# Patient Record
Sex: Female | Born: 1990 | Race: Black or African American | Hispanic: No | Marital: Single | Smoking: Never smoker
Health system: Southern US, Community
[De-identification: ages and names within clinical notes are randomized; demographics above are authoritative.]

## PROBLEM LIST (undated history)

## (undated) ENCOUNTER — Inpatient Hospital Stay (HOSPITAL_COMMUNITY): Payer: Self-pay

## (undated) ENCOUNTER — Emergency Department (HOSPITAL_COMMUNITY): Admission: EM | Payer: Self-pay

## (undated) DIAGNOSIS — Z789 Other specified health status: Secondary | ICD-10-CM

## (undated) DIAGNOSIS — Z9889 Other specified postprocedural states: Secondary | ICD-10-CM

## (undated) DIAGNOSIS — R519 Headache, unspecified: Secondary | ICD-10-CM

## (undated) HISTORY — DX: Headache, unspecified: R51.9

---

## 2015-02-23 ENCOUNTER — Inpatient Hospital Stay (HOSPITAL_COMMUNITY)
Admission: AD | Admit: 2015-02-23 | Discharge: 2015-02-23 | Disposition: A | Discharge status: Home / Self Care | Payer: Self-pay | Source: Ambulatory Visit | Attending: Obstetrics and Gynecology | Admitting: Obstetrics and Gynecology

## 2015-02-23 ENCOUNTER — Encounter (HOSPITAL_COMMUNITY): Payer: Self-pay

## 2015-02-23 ENCOUNTER — Inpatient Hospital Stay (HOSPITAL_COMMUNITY): Payer: Self-pay

## 2015-02-23 DIAGNOSIS — O23591 Infection of other part of genital tract in pregnancy, first trimester: Secondary | ICD-10-CM | POA: Insufficient documentation

## 2015-02-23 DIAGNOSIS — N760 Acute vaginitis: Secondary | ICD-10-CM | POA: Insufficient documentation

## 2015-02-23 DIAGNOSIS — Z3A01 Less than 8 weeks gestation of pregnancy: Secondary | ICD-10-CM | POA: Insufficient documentation

## 2015-02-23 DIAGNOSIS — O26899 Other specified pregnancy related conditions, unspecified trimester: Secondary | ICD-10-CM

## 2015-02-23 DIAGNOSIS — A499 Bacterial infection, unspecified: Secondary | ICD-10-CM

## 2015-02-23 DIAGNOSIS — B9689 Other specified bacterial agents as the cause of diseases classified elsewhere: Secondary | ICD-10-CM | POA: Insufficient documentation

## 2015-02-23 DIAGNOSIS — R109 Unspecified abdominal pain: Secondary | ICD-10-CM | POA: Insufficient documentation

## 2015-02-23 DIAGNOSIS — O9989 Other specified diseases and conditions complicating pregnancy, childbirth and the puerperium: Secondary | ICD-10-CM

## 2015-02-23 HISTORY — DX: Other specified health status: Z78.9

## 2015-02-23 LAB — URINALYSIS, ROUTINE W REFLEX MICROSCOPIC
Bilirubin Urine: NEGATIVE
GLUCOSE, UA: NEGATIVE mg/dL
HGB URINE DIPSTICK: NEGATIVE
KETONES UR: 15 mg/dL — AB
Leukocytes, UA: NEGATIVE
Nitrite: NEGATIVE
PROTEIN: 100 mg/dL — AB
Specific Gravity, Urine: 1.025 (ref 1.005–1.030)
Urobilinogen, UA: 1 mg/dL (ref 0.0–1.0)
pH: 7 (ref 5.0–8.0)

## 2015-02-23 LAB — WET PREP, GENITAL
TRICH WET PREP: NONE SEEN
YEAST WET PREP: NONE SEEN

## 2015-02-23 LAB — CBC
HCT: 32.5 % — ABNORMAL LOW (ref 36.0–46.0)
HEMOGLOBIN: 11 g/dL — AB (ref 12.0–15.0)
MCH: 27.8 pg (ref 26.0–34.0)
MCHC: 33.8 g/dL (ref 30.0–36.0)
MCV: 82.1 fL (ref 78.0–100.0)
Platelets: 418 10*3/uL — ABNORMAL HIGH (ref 150–400)
RBC: 3.96 MIL/uL (ref 3.87–5.11)
RDW: 15.6 % — ABNORMAL HIGH (ref 11.5–15.5)
WBC: 7.6 10*3/uL (ref 4.0–10.5)

## 2015-02-23 LAB — ABO/RH: ABO/RH(D): A POS

## 2015-02-23 LAB — URINE MICROSCOPIC-ADD ON

## 2015-02-23 LAB — HCG, QUANTITATIVE, PREGNANCY: hCG, Beta Chain, Quant, S: 86273 m[IU]/mL — ABNORMAL HIGH (ref <5)

## 2015-02-23 LAB — POCT PREGNANCY, URINE: Preg Test, Ur: POSITIVE — AB

## 2015-02-23 MED ORDER — METRONIDAZOLE 500 MG PO TABS: 500.0000 mg | ORAL_TABLET | Freq: Two times a day (BID) | ORAL | Status: DC

## 2015-02-23 NOTE — MAU Provider Note (Signed)
History     CSN: [id number]  Arrival date and time: 02/23/15 1150   First Provider Initiated Contact with Patient 02/23/15 1312         Chief Complaint  Patient presents with  . Abdominal Pain   HPI  Deanna May is a 24 y.o. G4P3003 at unknown gestation who present s/p fall with abdominal cramping.  Tripped over chair last night and landed on abdomen. LMP was sometime the beginning of July, she thinks. Lower abdominal cramping since last night.  Denies vaginal bleeding.  Some vaginal discharge. No vaginal irritation.  Denies n/v/d/constipation.    OB History    Gravida Para Term Preterm AB TAB SAB Ectopic Multiple Living   Past Medical History  Diagnosis Date  . Medical history non-contributory     Past Surgical History  Procedure Laterality Date  . Cesarean section      History reviewed. No pertinent family history.  Social History  Substance Use Topics  . Smoking status: Never Smoker   . Smokeless tobacco: Never Used  . Alcohol Use: No    Allergies: No Known Allergies  No prescriptions prior to admission    Review of Systems  Constitutional: Negative.   Respiratory: Negative.   Cardiovascular: Negative.   Gastrointestinal: Positive for nausea and abdominal pain. Negative for vomiting, diarrhea and constipation.  Genitourinary: Negative for dysuria.       No vaginal bleeding + vaginal discharge - small amount of thin white d/c, no odor, no irritation  Neurological: Negative for headaches.   Physical Exam   Blood pressure 118/63, pulse 93, temperature 98.6 F (37 C), resp. rate 18, height  (1.727 m), weight 153 lb (69.4 kg), last menstrual period 11/30/2014.  Physical Exam  Nursing note and vitals reviewed. Constitutional: She is oriented to person, place, and time. She appears well-developed and well-nourished. No distress.  HENT:  Head: Normocephalic and atraumatic.  Eyes: Conjunctivae are normal. Right eye  exhibits no discharge. Left eye exhibits no discharge. No scleral icterus.  Neck: Normal range of motion.  Cardiovascular: Normal rate, regular rhythm and normal heart sounds.   No murmur heard. Respiratory: Effort normal and breath sounds normal. No respiratory distress. She has no wheezes.  GI: Soft. Bowel sounds are normal. She exhibits no distension and no mass. There is no tenderness. There is no rebound.  Genitourinary: Vagina normal and uterus normal. Cervix exhibits discharge. Cervix exhibits no motion tenderness and no friability. Right adnexum displays no mass, no tenderness and no fullness. Left adnexum displays no mass, no tenderness and no fullness.  Minimal amount of clear mucoid d/c Cervix closed  Neurological: She is alert and oriented to person, place, and time.  Skin: Skin is warm and dry. She is not diaphoretic.  Psychiatric: She has a normal mood and affect. Her behavior is normal. Judgment and thought content normal.    MAU Course  Procedures Results for orders placed or performed during the hospital encounter of 02/23/15 (from the past 24 hour(s))  Urinalysis, Routine w reflex microscopic (not at [facility])     Status: Abnormal   Collection Time: 02/23/15 12:00 PM  Result Value Ref Range   Color, Urine YELLOW YELLOW   APPearance CLOUDY (A) CLEAR   Specific Gravity, Urine 1.025 1.005 - 1.030   pH 7.0 5.0 - 8.0   Glucose, UA NEGATIVE NEGATIVE mg/dL   Hgb urine dipstick NEGATIVE NEGATIVE  Bilirubin Urine NEGATIVE NEGATIVE   Ketones, ur 15 (A) NEGATIVE mg/dL   Protein, ur 130 (A) NEGATIVE mg/dL   Urobilinogen, UA 1.0 0.0 - 1.0 mg/dL   Nitrite NEGATIVE NEGATIVE   Leukocytes, UA NEGATIVE NEGATIVE  Urine microscopic-add on     Status: Abnormal   Collection Time: 02/23/15 12:00 PM  Result Value Ref Range   Squamous Epithelial / LPF MANY (A) RARE   WBC, UA 3-6 <3 WBC/hpf   Bacteria, UA FEW (A) RARE   Urine-Other MUCOUS PRESENT   Pregnancy, urine POC     Status:  Abnormal   Collection Time: 02/23/15 12:19 PM  Result Value Ref Range   Preg Test, Ur POSITIVE (A) NEGATIVE  CBC     Status: Abnormal   Collection Time: 02/23/15  1:30 PM  Result Value Ref Range   WBC 7.6 4.0 - 10.5 K/uL   RBC 3.96 3.87 - 5.11 MIL/uL   Hemoglobin 11.0 (L) 12.0 - 15.0 g/dL   HCT 86.5 (L) 78.4 - 69.6 %   MCV 82.1 78.0 - 100.0 fL   MCH 27.8 26.0 - 34.0 pg   MCHC 33.8 30.0 - 36.0 g/dL   RDW 29.5 (H) 28.4 - 13.2 %   Platelets 418 (H) 150 - 400 K/uL  ABO/Rh     Status: None (Preliminary result)   Collection Time: 02/23/15  1:30 PM  Result Value Ref Range   ABO/RH(D) A POS   hCG, quantitative, pregnancy     Status: Abnormal   Collection Time: 02/23/15  1:30 PM  Result Value Ref Range   hCG, Beta Chain, Quant, S 44010 (H) <5 mIU/mL  Wet prep, genital     Status: Abnormal   Collection Time: 02/23/15  1:30 PM  Result Value Ref Range   Yeast Wet Prep HPF POC NONE SEEN NONE SEEN   Trich, Wet Prep NONE SEEN NONE SEEN   Clue Cells Wet Prep HPF POC MODERATE (A) NONE SEEN   WBC, Wet Prep HPF POC FEW (A) NONE SEEN   US Ob Comp Less 14 Wks  02/23/2015   CLINICAL DATA:  24 year old female with pelvic cramping 02/22/2015 in the evening.  EXAM: OBSTETRIC <14 WK [place] AND TRANSVAGINAL OB US  TECHNIQUE: Both transabdominal and transvaginal ultrasound examinations were performed for complete evaluation of the gestation as well as the maternal uterus, adnexal regions, and pelvic cul-de-sac. Transvaginal technique was performed to assess early pregnancy.  COMPARISON:  No priors.  FINDINGS: Intrauterine gestational sac: Single gestational sac could ovoid in shape.  Yolk sac:  Present.  Embryo:  Present.  Cardiac Activity: Present.  Heart Rate: 149  bpm  CRL:  12  mm   7 w   2 d                  [place] EDC: 10/10/2015  Maternal uterus/adnexae: No subchorionic hemorrhage or other acute abnormality. No free fluid in the cul-de-sac. Bilateral ovaries are normal in appearance.  IMPRESSION: 1. Single  viable early IUP with estimated gestational age of [redacted] weeks and 2 days, and normal fetal heart rate of 149 beats per min.   Electronically Signed   By: Trudie Reed M.D.   On: 02/23/2015 15:10   US Ob Transvaginal  02/23/2015   CLINICAL DATA:  24 year old female with pelvic cramping 02/22/2015 in the evening.  EXAM: OBSTETRIC <14 WK [place] AND TRANSVAGINAL OB US  TECHNIQUE: Both transabdominal and transvaginal ultrasound examinations were performed for complete evaluation of the gestation  as well as the maternal uterus, adnexal regions, and pelvic cul-de-sac. Transvaginal technique was performed to assess early pregnancy.  COMPARISON:  No priors.  FINDINGS: Intrauterine gestational sac: Single gestational sac could ovoid in shape.  Yolk sac:  Present.  Embryo:  Present.  Cardiac Activity: Present.  Heart Rate: 149  bpm  CRL:  12  mm   7 w   2 d                  [place] EDC: 10/10/2015  Maternal uterus/adnexae: No subchorionic hemorrhage or other acute abnormality. No free fluid in the cul-de-sac. Bilateral ovaries are normal in appearance.  IMPRESSION: 1. Single viable early IUP with estimated gestational age of [redacted] weeks and 2 days, and normal fetal heart rate of 149 beats per min.   Electronically Signed   By: Trudie Reed M.D.   On: 02/23/2015 15:10     MDM Unable to doppler FHT; will send for ultrasound CBC, BHCG, abo/rh, HIV, wet prep, GC/CT SIUP @ [redacted]w[redacted]d Assessment and Plan  A: 1. Abdominal pain in pregnancy   2. Bacterial vaginosis    P: Discharge home Rx flagyl Pregnancy verification letter & list of providers given Start prenatal care Increase fluid intake and take prenatal vitamins Discussed reasons to return to MAU  GC/CT & HIV pending  Judeth Horn, NP  02/23/2015, 1:02 PM

## 2015-02-23 NOTE — MAU Note (Signed)
Denies uti s/s, LMP roughly in July.

## 2015-02-23 NOTE — Discharge Instructions (Signed)
°Abdominal Pain During Pregnancy °Abdominal pain is common in pregnancy. Most of the time, it does not cause harm. There are many causes of abdominal pain. Some causes are more serious than others. Some of the causes of abdominal pain in pregnancy are easily diagnosed. Occasionally, the diagnosis takes time to understand. Other times, the cause is not determined. Abdominal pain can be a sign that something is very wrong with the pregnancy, or the pain may have nothing to do with the pregnancy at all. For this reason, always tell your health care provider if you have any abdominal discomfort. °HOME CARE INSTRUCTIONS  °Monitor your abdominal pain for any changes. The following actions may help to alleviate any discomfort you are experiencing: °· Do not have sexual intercourse or put anything in your vagina until your symptoms go away completely. °· Get plenty of rest until your pain improves. °· Drink clear fluids if you feel nauseous. Avoid solid food as long as you are uncomfortable or nauseous. °· Only take over-the-counter or prescription medicine as directed by your health care provider. °· Keep all follow-up appointments with your health care provider. °SEEK IMMEDIATE MEDICAL CARE IF: °· You are bleeding, leaking fluid, or passing tissue from the vagina. °· You have increasing pain or cramping. °· You have persistent vomiting. °· You have painful or bloody urination. °· You have a fever. °· You notice a decrease in your baby's movements. °· You have extreme weakness or feel faint. °· You have shortness of breath, with or without abdominal pain. °· You develop a severe headache with abdominal pain. °· You have abnormal vaginal discharge with abdominal pain. °· You have persistent diarrhea. °· You have abdominal pain that continues even after rest, or gets worse. °MAKE SURE YOU:  °· Understand these instructions. °· Will watch your condition. °· Will get help right away if you are not doing well or get  worse. °Document Released: 05/18/2005 Document Revised: 03/08/2013 Document Reviewed: 12/15/2012 °ExitCare® Patient Information ©2015 [employer]. This information is not intended to replace advice given to you by your health care provider. Make sure you discuss any questions you have with your health care provider. °Bacterial Vaginosis °Bacterial vaginosis is a vaginal infection that occurs when the normal balance of bacteria in the vagina is disrupted. It results from an overgrowth of certain bacteria. This is the most common vaginal infection in women of childbearing age. Treatment is important to prevent complications, especially in pregnant women, as it can cause a premature delivery. °CAUSES  °Bacterial vaginosis is caused by an increase in harmful bacteria that are normally present in smaller amounts in the vagina. Several different kinds of bacteria can cause bacterial vaginosis. However, the reason that the condition develops is not fully understood. °RISK FACTORS °Certain activities or behaviors can put you at an increased risk of developing bacterial vaginosis, including: °· Having a new sex partner or multiple sex partners. °· Douching. °· Using an intrauterine device (IUD) for contraception. °Women do not get bacterial vaginosis from toilet seats, bedding, swimming pools, or contact with objects around them. °SIGNS AND SYMPTOMS  °Some women with bacterial vaginosis have no signs or symptoms. Common symptoms include: °· Grey vaginal discharge. °· A fishlike odor with discharge, especially after sexual intercourse. °· Itching or burning of the vagina and vulva. °· Burning or pain with urination. °DIAGNOSIS  °Your health care provider will take a medical history and examine the vagina for signs of bacterial vaginosis. A sample of vaginal fluid may   be taken. Your health care provider will look at this sample under a microscope to check for bacteria and abnormal cells. A vaginal pH test may also be done.   °TREATMENT  °Bacterial vaginosis may be treated with antibiotic medicines. These may be given in the form of a pill or a vaginal cream. A second round of antibiotics may be prescribed if the condition comes back after treatment.  °HOME CARE INSTRUCTIONS  °· Only take over-the-counter or prescription medicines as directed by your health care provider. °· If antibiotic medicine was prescribed, take it as directed. Make sure you finish it even if you start to feel better. °· Do not have sex until treatment is completed. °· Tell all sexual partners that you have a vaginal infection. They should see their health care provider and be treated if they have problems, such as a mild rash or itching. °· Practice safe sex by using condoms and only having one sex partner. °SEEK MEDICAL CARE IF:  °· Your symptoms are not improving after 3 days of treatment. °· You have increased discharge or pain. °· You have a fever. °MAKE SURE YOU:  °· Understand these instructions. °· Will watch your condition. °· Will get help right away if you are not doing well or get worse. °FOR MORE INFORMATION  °Centers for Disease Control and Prevention, Division of STD Prevention: www.cdc.gov/std °American Sexual Health Association (ASHA): www.ashastd.org  °Document Released: 05/18/2005 Document Revised: 03/08/2013 Document Reviewed: 12/28/2012 °ExitCare® Patient Information ©2015 [employer]. This information is not intended to replace advice given to you by your health care provider. Make sure you discuss any questions you have with your health care provider. ° °

## 2015-02-23 NOTE — MAU Note (Signed)
Pt presents to MAU with complaints of lower abdominal cramping with + home pregnancy test two weeks ago. Also states that she fell over a chair and landed on her abdomen. Denies any vaginal bleeding

## 2015-02-23 NOTE — MAU Note (Signed)
Unable to doppler FHTs 

## 2015-02-24 LAB — HIV ANTIBODY (ROUTINE TESTING W REFLEX): HIV Screen 4th Generation wRfx: NONREACTIVE

## 2015-02-25 LAB — GC/CHLAMYDIA PROBE AMP (~~LOC~~) NOT AT ARMC
CHLAMYDIA, DNA PROBE: NEGATIVE
Neisseria Gonorrhea: NEGATIVE

## 2015-12-29 ENCOUNTER — Encounter (HOSPITAL_COMMUNITY): Payer: Self-pay

## 2016-06-01 DIAGNOSIS — F32A Depression, unspecified: Secondary | ICD-10-CM

## 2016-06-01 HISTORY — DX: Depression, unspecified: F32.A

## 2018-06-10 ENCOUNTER — Encounter: Payer: Self-pay | Admitting: Internal Medicine

## 2018-06-10 ENCOUNTER — Ambulatory Visit: Payer: Self-pay | Admitting: Internal Medicine

## 2018-06-10 VITALS — BP 126/82 | HR 72 | Resp 12 | Ht 69.0 in | Wt 180.0 lb

## 2018-06-10 DIAGNOSIS — T8332XA Displacement of intrauterine contraceptive device, initial encounter: Secondary | ICD-10-CM

## 2018-06-10 DIAGNOSIS — N898 Other specified noninflammatory disorders of vagina: Secondary | ICD-10-CM

## 2018-06-10 DIAGNOSIS — F321 Major depressive disorder, single episode, moderate: Secondary | ICD-10-CM

## 2018-06-10 DIAGNOSIS — R102 Pelvic and perineal pain: Secondary | ICD-10-CM

## 2018-06-10 LAB — POCT URINALYSIS DIPSTICK
Blood, UA: NEGATIVE
Glucose, UA: NEGATIVE
Leukocytes, UA: NEGATIVE
Nitrite, UA: NEGATIVE
PROTEIN UA: POSITIVE — AB
Spec Grav, UA: >=1.03 — AB (ref 1.010–1.025)
Urobilinogen, UA: 1 E.U./dL
pH, UA: 6.5 (ref 5.0–8.0)

## 2018-06-10 LAB — POCT WET PREP WITH KOH
KOH Prep POC: NEGATIVE
RBC Wet Prep HPF POC: NEGATIVE
Trichomonas, UA: NEGATIVE
YEAST WET PREP PER HPF POC: NEGATIVE

## 2018-06-10 MED ORDER — METRONIDAZOLE 500 MG PO TABS
ORAL_TABLET | ORAL | 0 refills | Status: DC
Start: 2018-06-10 — End: 2021-03-08

## 2018-06-10 MED ORDER — BUPROPION HCL ER (SR) 150 MG PO TB12: ORAL_TABLET | ORAL | 2 refills | Status: DC

## 2018-06-10 MED ORDER — DICLOFENAC SODIUM 75 MG PO TBEC: DELAYED_RELEASE_TABLET | ORAL | 4 refills | Status: DC
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
## 2018-06-20 ENCOUNTER — Telehealth: Payer: Self-pay | Admitting: Internal Medicine

## 2018-06-20 DIAGNOSIS — F321 Major depressive disorder, single episode, moderate: Secondary | ICD-10-CM | POA: Insufficient documentation

## 2018-06-21 NOTE — Telephone Encounter (Signed)
Patient has been scheduled for [place] on 06/27/18 @ 3:30 pm. Patient verbalized understanding. Patient needs fill bladder at least 32 oz. Of water

## 2018-06-27 ENCOUNTER — Ambulatory Visit (HOSPITAL_COMMUNITY)
Admission: RE | Admit: 2018-06-27 | Discharge: 2018-06-27 | Disposition: A | Discharge status: Home / Self Care | Payer: Self-pay | Source: Ambulatory Visit | Attending: Internal Medicine | Admitting: Internal Medicine

## 2018-06-27 DIAGNOSIS — T8332XA Displacement of intrauterine contraceptive device, initial encounter: Secondary | ICD-10-CM | POA: Insufficient documentation

## 2018-06-28 ENCOUNTER — Ambulatory Visit: Payer: Self-pay | Admitting: Internal Medicine

## 2018-12-23 ENCOUNTER — Other Ambulatory Visit: Payer: Self-pay

## 2018-12-23 ENCOUNTER — Encounter: Payer: Self-pay | Admitting: Internal Medicine

## 2018-12-23 ENCOUNTER — Ambulatory Visit: Payer: Self-pay | Admitting: Internal Medicine

## 2018-12-23 VITALS — BP 122/68 | HR 60 | Resp 12 | Ht 69.0 in | Wt 175.0 lb

## 2018-12-23 DIAGNOSIS — R102 Pelvic and perineal pain: Secondary | ICD-10-CM

## 2018-12-23 DIAGNOSIS — F321 Major depressive disorder, single episode, moderate: Secondary | ICD-10-CM

## 2018-12-23 DIAGNOSIS — T192XXA Foreign body in vulva and vagina, initial encounter: Secondary | ICD-10-CM

## 2018-12-23 LAB — POCT WET PREP WITH KOH
Clue Cells Wet Prep HPF POC: NEGATIVE
KOH Prep POC: NEGATIVE
RBC Wet Prep HPF POC: NEGATIVE
Trichomonas, UA: NEGATIVE
Yeast Wet Prep HPF POC: NEGATIVE

## 2018-12-23 NOTE — Progress Notes (Signed)
    Subjective:    Patient ID: Deanna May, female   DOB: [birth year], 28 y.o.   MRN: [id number]   HPI   1.  Pelvic pain:  Again, since about 1 week before first seen on 06/10/2018.   Has had Mirena IUD in place for about 2 years now.  Did have STI testing before being seen [facility] [place] [facility] [place].  States was told the testing was all okay.  She is not sure what was tested. Had a pelvic ultrasound after last seen as could not see IUD strings extending from cervical os.   IUD was seen in place in uterus and no other abnormal findings noted 06/27/2018. Was found on wet prep to have BV and treated with Metronidazole 500 mg twice daily for 7 days.  Her discharge and itching went away, but the pain did not resolve. Since last visit, she has been sexually active and that is not painful.  May have left suprapubic/pelvic discomfort after intercourse-generally about 1 hour later if occurs.  Pain generally lasts 1-2 hours when occurs.   The pain is always in the left pelvic area. She states the pain can come on at any time, not just after intercourse.  The pain is sharp and stabbing in nature. Urination and BMs do not seem to affect or initiate the pain. Eating has no effect.   She has two female partners, one new from when last seen.  They do not use condoms.   She has checked with her partners and neither admits to any symptoms or having other partners.  She denies any genital lesions or discharge at this point.   2.  Depression:  She filled the Bupropion prescription, but did not take it.   She did not follow up with SW intern at the time as recommended as well. She is a little better--working now, which has helped the stress.    Current Meds  Medication Sig  . levonorgestrel (MIRENA) 20 MCG/24HR IUD 1 each by Intrauterine route once.   No Known Allergies   Review of Systems    Objective:   BP 122/68 (BP Location: Left Arm, Patient Position:  Sitting, Cuff Size: Normal)   Pulse 60   Resp 12   Ht 5\' 9"  (1.753 m)   Wt 175 lb (79.4 kg)   BMI 25.84 kg/m   Physical Exam  NAD Lungs:  CTA CV:  RRR without murmur or rub, radial pulses normal and equal. Abd:  S, NT, No HSM or mass.  + BS GU:  Normal external female genitalia.  On visualization with speculum of vaginal canal, found to have a retained tampon, which was removed easily.  Minimal discharge associated.  Minimal odor.  IUD strings noted today from cervical os.  No vaginal or cervical inflammation or lesion. No CMT. No uterine or adnexal mass or tenderness.     Assessment & Plan   1.  Improved pelvic pain--now only intermittently on left.  Not clear if retained tampon has anything to do with this.  To call if symptoms continue or worsen. Wet prep without obvious findings. GC/Chlamydia, HIV, RPR  2.  Depression:  She is not certain about starting the Bupropion.  Feeling better with start of job.   She will call if she decides to initiate medication and will have her follow up 1 week after start.    3.  Retained tampon:  Removed without difficulty today.

## 2018-12-23 NOTE — Patient Instructions (Signed)
Call if you decide to start the Bupropion

## 2018-12-23 NOTE — Progress Notes (Signed)
Met with patient for SDOH, GAD-7, PHQ-9 screening.  Patient stated she is currently struggling financially and will need to find a new home very soon.  Patient is currently living with partner, 4 children (all under the age of 12), and her mother.  Patient is working but stated she is having issues with transportation due to car trouble.  Patient has made an appointment for counseling with LCSW-A. 

## 2018-12-24 LAB — RPR QUALITATIVE: RPR Ser Ql: NONREACTIVE

## 2018-12-24 LAB — HIV ANTIBODY (ROUTINE TESTING W REFLEX): HIV Screen 4th Generation wRfx: NONREACTIVE

## 2018-12-28 LAB — GC/CHLAMYDIA PROBE AMP
Chlamydia trachomatis, NAA: NEGATIVE
Neisseria Gonorrhoeae by PCR: NEGATIVE

## 2019-01-05 ENCOUNTER — Other Ambulatory Visit: Payer: Self-pay | Admitting: Licensed Clinical Social Worker

## 2019-03-28 ENCOUNTER — Ambulatory Visit: Payer: Self-pay | Admitting: Internal Medicine

## 2019-05-28 ENCOUNTER — Ambulatory Visit: Payer: Self-pay

## 2019-05-28 NOTE — Telephone Encounter (Signed)
Pt called c/o covid sx: persistent headache, body aches, fatigue, chills. Denies SOB or chest pain or breathing difficulties.  Covid -19 sx began 05/26/19. Pt had exposure to Covid 19.  Advised pt to go to [facility] covid 19 testing page to make appointment for testing.  Care advice given and pt verbalized understanding.  Reason for Disposition . [1] COVID-19 infection suspected by caller or triager AND [2] mild symptoms (cough, fever, or others) AND [7] no complications or SOB  Answer Assessment - Initial Assessment Questions 1. COVID-19 DIAGNOSIS: "Who made your Coronavirus (COVID-19) diagnosis?" "Was it confirmed by a positive lab test?" If not diagnosed by a HCP, ask "Are there lots of cases (community spread) where you live?" (See public health department website, if unsure)     N/a-no-yes 2. COVID-19 EXPOSURE: "Was there any known exposure to COVID before the symptoms began?" CDC Definition of close contact: within 6 feet (2 meters) for a total of 15 minutes or more over a 24-hour period.      Yes-unsure of contact- health care provider 3. ONSET: "When did the COVID-19 symptoms start?"      Friday 4. WORST SYMPTOM: "What is your worst symptom?" (e.g., cough, fever, shortness of breath, muscle aches)    Persistent Headache, muscle aches 5. COUGH: "Do you have a cough?" If so, ask: "How bad is the cough?"       no 6. FEVER: "Do you have a fever?" If so, ask: "What is your temperature, how was it measured, and when did it start?"      Chills- no thermometer 7. RESPIRATORY STATUS: "Describe your breathing?" (e.g., shortness of breath, wheezing, unable to speak)      Normal 8. BETTER-SAME-WORSE: "Are you getting better, staying the same or getting worse compared to yesterday?"  If getting worse, ask, "In what way?"     same 9. HIGH RISK DISEASE: "Do you have any chronic medical problems?" (e.g., asthma, heart or lung disease, weak immune system, obesity, etc.)     no 10. PREGNANCY: "Is  there any chance you are pregnant?" "When was your last menstrual period?"       No- LMP 05/19/19 11. OTHER SYMPTOMS: "Do you have any other symptoms?"  (e.g., chills, fatigue, headache, loss of smell or taste, muscle pain, sore throat; new loss of smell or taste especially support the diagnosis of COVID-19)       Chills, persistent H/A, fatigue, muscle pain and body aches  Protocols used: CORONAVIRUS (COVID-19) DIAGNOSED OR SUSPECTED-A-AH

## 2019-05-29 ENCOUNTER — Ambulatory Visit: Payer: HRSA Program | Attending: Internal Medicine

## 2019-05-29 DIAGNOSIS — Z20822 Contact with and (suspected) exposure to covid-19: Secondary | ICD-10-CM

## 2019-05-29 DIAGNOSIS — Z20828 Contact with and (suspected) exposure to other viral communicable diseases: Secondary | ICD-10-CM | POA: Diagnosis present

## 2019-05-31 LAB — NOVEL CORONAVIRUS, NAA: SARS-CoV-2, NAA: NOT DETECTED

## 2021-01-10 ENCOUNTER — Emergency Department (HOSPITAL_BASED_OUTPATIENT_CLINIC_OR_DEPARTMENT_OTHER)
Admission: EM | Admit: 2021-01-10 | Discharge: 2021-01-10 | Disposition: A | Discharge status: Home / Self Care | Payer: No Typology Code available for payment source | Attending: Emergency Medicine | Admitting: Emergency Medicine

## 2021-01-10 ENCOUNTER — Emergency Department (HOSPITAL_BASED_OUTPATIENT_CLINIC_OR_DEPARTMENT_OTHER): Payer: No Typology Code available for payment source | Admitting: Radiology

## 2021-01-10 ENCOUNTER — Other Ambulatory Visit: Payer: Self-pay

## 2021-01-10 ENCOUNTER — Encounter (HOSPITAL_BASED_OUTPATIENT_CLINIC_OR_DEPARTMENT_OTHER): Payer: Self-pay | Admitting: Emergency Medicine

## 2021-01-10 DIAGNOSIS — R0781 Pleurodynia: Secondary | ICD-10-CM | POA: Diagnosis not present

## 2021-01-10 DIAGNOSIS — Y92410 Unspecified street and highway as the place of occurrence of the external cause: Secondary | ICD-10-CM | POA: Insufficient documentation

## 2021-01-10 DIAGNOSIS — M549 Dorsalgia, unspecified: Secondary | ICD-10-CM | POA: Insufficient documentation

## 2021-01-10 MED ORDER — KETOROLAC TROMETHAMINE 30 MG/ML IJ SOLN
30.0000 mg | Freq: Once | INTRAMUSCULAR | Status: AC
  Administered 2021-01-10: 30 mg via INTRAMUSCULAR

## 2021-01-10 NOTE — Discharge Instructions (Addendum)
You will likely have aches and pains that will be worse tomorrow.  Recommend Tylenol, NSAIDs for pain control in addition to intermittent ice and heating to the affected areas.

## 2021-01-10 NOTE — ED Notes (Signed)
Pt verbalizes understanding of discharge instructions. Opportunity for questioning and answers were provided. Armand removed by staff, pt discharged from ED to home. Educated on [facility] care.
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
## 2021-03-08 ENCOUNTER — Emergency Department (HOSPITAL_BASED_OUTPATIENT_CLINIC_OR_DEPARTMENT_OTHER)
Admission: EM | Admit: 2021-03-08 | Discharge: 2021-03-08 | Disposition: A | Discharge status: Home / Self Care | Payer: Self-pay | Attending: Emergency Medicine | Admitting: Emergency Medicine

## 2021-03-08 ENCOUNTER — Encounter (HOSPITAL_BASED_OUTPATIENT_CLINIC_OR_DEPARTMENT_OTHER): Payer: Self-pay | Admitting: Emergency Medicine

## 2021-03-08 ENCOUNTER — Other Ambulatory Visit: Payer: Self-pay

## 2021-03-08 DIAGNOSIS — B9689 Other specified bacterial agents as the cause of diseases classified elsewhere: Secondary | ICD-10-CM | POA: Insufficient documentation

## 2021-03-08 DIAGNOSIS — N760 Acute vaginitis: Secondary | ICD-10-CM | POA: Insufficient documentation

## 2021-03-08 LAB — URINALYSIS, COMPLETE (UACMP) WITH MICROSCOPIC
Bilirubin Urine: NEGATIVE
Glucose, UA: NEGATIVE mg/dL
Ketones, ur: 15 mg/dL — AB
Leukocytes,Ua: NEGATIVE
Nitrite: NEGATIVE
Specific Gravity, Urine: 1.028 (ref 1.005–1.030)
pH: 6 (ref 5.0–8.0)

## 2021-03-08 LAB — PREGNANCY, URINE: Preg Test, Ur: NEGATIVE

## 2021-03-08 LAB — WET PREP, GENITAL
Sperm: NONE SEEN
Trich, Wet Prep: NONE SEEN
Yeast Wet Prep HPF POC: NONE SEEN

## 2021-03-08 LAB — HIV ANTIBODY (ROUTINE TESTING W REFLEX): HIV Screen 4th Generation wRfx: NONREACTIVE

## 2021-03-08 MED ORDER — METRONIDAZOLE 500 MG PO TABS: 500.0000 mg | ORAL_TABLET | Freq: Two times a day (BID) | ORAL | 0 refills | Status: DC

## 2021-03-08 MED ORDER — ERYTHROMYCIN 5 MG/GM OP OINT: TOPICAL_OINTMENT | OPHTHALMIC | 0 refills | Status: DC

## 2021-03-08 MED ORDER — METRONIDAZOLE 500 MG PO TABS: 500.0000 mg | ORAL_TABLET | Freq: Two times a day (BID) | ORAL | 0 refills | Status: AC

## 2021-03-08 NOTE — ED Provider Notes (Signed)
[facility] EMERGENCY DEPT Provider Note   CSN: [id number] Arrival date & time: 03/08/21  1630     History Chief Complaint  Patient presents with   Vaginal Discharge    Deanna May is a 30 y.o. female.  Concern for possible STD.  Having some vaginal discharge.  Denies any abdominal pain or pelvic pain.  The history is provided by the patient.  Vaginal Discharge Quality:  Thin Severity:  Mild Onset quality:  Gradual Timing:  Constant Progression:  Unchanged Context: spontaneously   Relieved by:  Nothing Worsened by:  Nothing Associated symptoms: no abdominal pain, no dyspareunia, no dysuria, no fever, no genital lesions, no nausea, no rash, no urinary frequency, no urinary hesitancy, no urinary incontinence, no vaginal itching and no vomiting       History reviewed. No pertinent past medical history.  Patient Active Problem List   Diagnosis Date Noted   Pelvic pain 12/23/2018   Current moderate episode of major depressive disorder (HCC) 06/20/2018    Past Surgical History:  Procedure Laterality Date   CESAREAN SECTION  2009, 2011, 2014, 2017     OB History     Gravida  4   Para  3   Term  3   Preterm      AB      Living  3      SAB      IAB      Ectopic      Multiple      Live Births              Family History  Problem Relation Age of Onset   Autism spectrum disorder Son    Asthma Sister    Allergies Sister    Autism spectrum disorder Son     Social History   Tobacco Use   Smoking status: Never   Smokeless tobacco: Never  Vaping Use   Vaping Use: Never used  Substance Use Topics   Alcohol use: Yes    Comment: occasionally   Drug use: No    Home Medications Prior to Admission medications   Medication Sig Start Date End Date Taking? Authorizing Provider  buPROPion (WELLBUTRIN SR) 150 MG 12 hr tablet 1 tab by mouth in the morning for 3 days then increase to twice daily Patient not taking: Reported on  12/23/2018 06/10/18   Julieanne Manson, MD  levonorgestrel (MIRENA) 20 MCG/24HR IUD 1 each by Intrauterine route once.    [provider]  metroNIDAZOLE (FLAGYL) 500 MG tablet Take 1 tablet (500 mg total) by mouth 2 (two) times daily for 7 days. 03/08/21 03/15/21  Virgina Norfolk, DO    Allergies    Patient has no known allergies.  Review of Systems   Review of Systems  Constitutional:  Negative for chills and fever.  HENT:  Negative for ear pain and sore throat.   Eyes:  Negative for pain and visual disturbance.  Respiratory:  Negative for cough and shortness of breath.   Cardiovascular:  Negative for chest pain and palpitations.  Gastrointestinal:  Negative for abdominal pain, nausea and vomiting.  Genitourinary:  Positive for vaginal discharge. Negative for bladder incontinence, decreased urine volume, difficulty urinating, dyspareunia, dysuria, enuresis, flank pain, genital sores, hematuria, hesitancy, urgency and vaginal bleeding.  Musculoskeletal:  Negative for arthralgias and back pain.  Skin:  Negative for color change and rash.  Neurological:  Negative for seizures and syncope.  All other systems reviewed and are negative.  Physical Exam  Updated Vital Signs BP 125/78 (BP Location: Right Arm)   Pulse 86   Temp 98.2 F (36.8 C)   Resp 16   Ht 5\' 9"  (1.753 m)   Wt 83 kg   SpO2 100%   BMI 27.02 kg/m   Physical Exam Exam conducted with a chaperone present.  Constitutional:      General: She is not in acute distress.    Appearance: She is not ill-appearing.  Abdominal:     General: Abdomen is flat.     Tenderness: There is no abdominal tenderness. There is no guarding.     Hernia: No hernia is present.  Genitourinary:    Labia:        Right: No rash or tenderness.        Left: No rash or tenderness.      Vagina: Normal.     Cervix: Discharge present. No cervical motion tenderness, friability, lesion, erythema or cervical bleeding.     Uterus: Normal.       Adnexa: Right adnexa normal and left adnexa normal.  Skin:    Capillary Refill: Capillary refill takes less than 2 seconds.  Neurological:     Mental Status: She is alert.    ED Results / Procedures / Treatments   Labs (all labs ordered are listed, but only abnormal results are displayed) Labs Reviewed  WET PREP, GENITAL - Abnormal; Notable for the following components:      Result Value   Clue Cells Wet Prep HPF POC PRESENT (*)    WBC, Wet Prep HPF POC FEW (*)    All other components within normal limits  URINALYSIS, COMPLETE (UACMP) WITH MICROSCOPIC - Abnormal; Notable for the following components:   Hgb urine dipstick SMALL (*)    Ketones, ur 15 (*)    Protein, ur TRACE (*)    Bacteria, UA RARE (*)    All other components within normal limits  PREGNANCY, URINE  HIV ANTIBODY (ROUTINE TESTING W REFLEX)  RPR  GC/CHLAMYDIA PROBE AMP ([place]) NOT AT [facility]    EKG None  Radiology No results found.  Procedures Procedures   Medications Ordered in ED Medications - No data to display  ED Course  I have reviewed the triage vital signs and the nursing notes.  Pertinent labs & imaging results that were available during my care of the patient were reviewed by me and considered in my medical decision making (see chart for details).    MDM Rules/Calculators/A&P                           Averyana Mccabe here for vaginal discharge.  Wants to be tested for STDs.  Positive for bacterial vaginosis.  No tenderness on GU exam.  Will defer STD treatment at this time given bacterial vaginosis.  Will treat with Flagyl.  Understands to abstain from sexual activity until gonorrhea and Chlamydia test are back.  Discharged in good condition.  This chart was dictated using voice recognition software.  Despite best efforts to proofread,  errors can occur which can change the documentation meaning.   Final Clinical Impression(s) / ED Diagnoses Final diagnoses:  Bacterial vaginosis     Rx / DC Orders ED Discharge Orders          Ordered    erythromycin ophthalmic ointment  Status:  Discontinued        03/08/21 1953    metroNIDAZOLE (FLAGYL) 500 MG tablet  2  times daily,   Status:  Discontinued        03/08/21 1957    metroNIDAZOLE (FLAGYL) 500 MG tablet  2 times daily        03/08/21 2006             Virgina Norfolk, DO 03/08/21 2008

## 2021-03-08 NOTE — ED Triage Notes (Signed)
Odorous vaginal discharge x 3 days , denies urinary symptoms, vaginal itching

## 2021-03-09 LAB — RPR: RPR Ser Ql: NONREACTIVE

## 2021-03-10 LAB — GC/CHLAMYDIA PROBE AMP (~~LOC~~) NOT AT ARMC
Chlamydia: NEGATIVE
Comment: NEGATIVE
Comment: NORMAL
Neisseria Gonorrhea: NEGATIVE

## 2021-07-03 ENCOUNTER — Telehealth: Payer: Self-pay

## 2021-07-03 NOTE — Telephone Encounter (Signed)
Patient would like a CPE, tb testing and STD testing. CPE and TB is for work. STD testing is routine after having a new partner.   I explained to patient that CPE would be about April so patient will see if she is able to get that done somewhere different.  Patient would still like STD and tb testing done with [place] if possible

## 2021-07-07 NOTE — Telephone Encounter (Signed)
We can do the STI testing, but for the TB testing, the cost would be whatever we pay for quantiferon.  Otherwise, she can get it done at [facility].

## 2021-07-14 NOTE — Telephone Encounter (Signed)
Attempted to call, unable to leave voicemail

## 2021-08-14 ENCOUNTER — Other Ambulatory Visit: Payer: Self-pay

## 2021-08-14 ENCOUNTER — Emergency Department (HOSPITAL_BASED_OUTPATIENT_CLINIC_OR_DEPARTMENT_OTHER)
Admission: EM | Admit: 2021-08-14 | Discharge: 2021-08-14 | Disposition: A | Discharge status: Home / Self Care | Payer: Self-pay | Attending: Emergency Medicine | Admitting: Emergency Medicine

## 2021-08-14 ENCOUNTER — Encounter (HOSPITAL_BASED_OUTPATIENT_CLINIC_OR_DEPARTMENT_OTHER): Payer: Self-pay

## 2021-08-14 DIAGNOSIS — N390 Urinary tract infection, site not specified: Secondary | ICD-10-CM | POA: Insufficient documentation

## 2021-08-14 LAB — URINALYSIS, ROUTINE W REFLEX MICROSCOPIC
Bilirubin Urine: NEGATIVE
Glucose, UA: NEGATIVE mg/dL
Ketones, ur: NEGATIVE mg/dL
Nitrite: NEGATIVE
Protein, ur: >300 mg/dL — AB
Specific Gravity, Urine: 1.025 (ref 1.005–1.030)
WBC, UA: >50 WBC/hpf — ABNORMAL HIGH (ref 0–5)
pH: 7 (ref 5.0–8.0)

## 2021-08-14 LAB — PREGNANCY, URINE: Preg Test, Ur: NEGATIVE

## 2021-08-14 MED ORDER — NITROFURANTOIN MONOHYD MACRO 100 MG PO CAPS: 100.0000 mg | ORAL_CAPSULE | Freq: Two times a day (BID) | ORAL | 0 refills | Status: DC

## 2021-08-14 NOTE — ED Provider Notes (Signed)
?[facility] EMERGENCY DEPT ?Provider Note ? ? ?CSN: [id number] ?Arrival date & time: 08/14/21  1902 ? ?  ? ?History ? ?Chief Complaint  ?Patient presents with  ? Dysuria  ? ? ?Deanna May is a 31 y.o. female.  Presents to the emergency department with concern for dysuria.  Patient states for the past 2 days she has noted pain while urinating, feels like her urine has been slightly more cloudy than normal.  Also has noted slight discomfort in her lower abdomen.  No abdominal pain at present.  No nausea or vomiting, no fevers.  Otherwise has been feeling fine. ? ?Denies any chronic medical problems ? ?HPI ? ?  ? ?Home Medications ?Prior to Admission medications   ?Medication Sig Start Date End Date Taking? Authorizing Provider  ?nitrofurantoin, macrocrystal-monohydrate, (MACROBID) 100 MG capsule Take 1 capsule (100 mg total) by mouth 2 (two) times daily. 08/14/21  Yes Lucrezia Starch, MD  ?buPROPion [facility] SR) 150 MG 12 hr tablet 1 tab by mouth in the morning for 3 days then increase to twice daily ?Patient not taking: Reported on 12/23/2018 06/10/18   Mack Hook, MD  ?levonorgestrel (MIRENA) 20 MCG/24HR IUD 1 each by Intrauterine route once.    [provider]  ?   ? ?Allergies    ?Patient has no known allergies.   ? ?Review of Systems   ?Review of Systems  ?Genitourinary:  Positive for dysuria.  ?All other systems reviewed and are negative. ? ?Physical Exam ?Updated Vital Signs ?BP (!) 108/56 (BP Location: Right Arm)   Pulse 63   Temp 98.4 ?F (36.9 ?C)   Resp 16   Ht 5\' 9"  (1.753 m)   Wt 75.8 kg   LMP 07/11/2021 (Approximate)   SpO2 99%   BMI 24.66 kg/m?  ?Physical Exam ?Vitals and nursing note reviewed.  ?Constitutional:   ?   General: She is not in acute distress. ?   Appearance: She is well-developed.  ?HENT:  ?   Head: Normocephalic and atraumatic.  ?Eyes:  ?   Conjunctiva/sclera: Conjunctivae normal.  ?Cardiovascular:  ?   Rate and Rhythm: Normal rate and regular  rhythm.  ?   Heart sounds: No murmur heard. ?Pulmonary:  ?   Effort: Pulmonary effort is normal. No respiratory distress.  ?   Breath sounds: Normal breath sounds.  ?Abdominal:  ?   Palpations: Abdomen is soft.  ?   Tenderness: There is no abdominal tenderness.  ?Musculoskeletal:     ?   General: No swelling.  ?   Cervical back: Neck supple.  ?Skin: ?   General: Skin is warm and dry.  ?   Capillary Refill: Capillary refill takes less than 2 seconds.  ?Neurological:  ?   Mental Status: She is alert.  ?Psychiatric:     ?   Mood and Affect: Mood normal.  ? ? ?ED Results / Procedures / Treatments   ?Labs ?(all labs ordered are listed, but only abnormal results are displayed) ?Labs Reviewed  ?URINALYSIS, ROUTINE W REFLEX MICROSCOPIC - Abnormal; Notable for the following components:  ?    Result Value  ? APPearance CLOUDY (*)   ? Hgb urine dipstick LARGE (*)   ? Protein, ur >300 (*)   ? Leukocytes,Ua LARGE (*)   ? WBC, UA >50 (*)   ? Non Squamous Epithelial 0-5 (*)   ? All other components within normal limits  ?PREGNANCY, URINE  ? ? ?EKG ?None ? ?Radiology ?No results found. ? ?  Procedures ?Procedures  ? ? ?Medications Ordered in ED ?Medications - No data to display ? ?ED Course/ Medical Decision Making/ A&P ?  ?                        ?Medical Decision Making ?Amount and/or Complexity of Data Reviewed ?Labs: ordered. ? ?Risk ?Prescription drug management. ? ? ?32 year old lady presenting to the emergency department with concern for dysuria.  On exam well-appearing in no distress.  Abdomen soft nontender.  Tolerating p.o. without difficulty, vitals normal, afebrile.  UA concerning for possible UTI. Do not feel she needs blood work or ct scan of abd pelv at present due to her overall clinical picutre and reassuring exam. Symptoms consistent with this diagnosis of uti, likely cystitis.  Provided Rx for ABX, reviewed return precautions and discharged. ? ? ? ?After the discussed management above, the patient was determined to  be safe for discharge.  The patient was in agreement with this plan and all questions regarding their care were answered.  ED return precautions were discussed and the patient will return to the ED with any significant worsening of condition. ? ? ? ? ? ? ? ? ?Final Clinical Impression(s) / ED Diagnoses ?Final diagnoses:  ?Dysuria  ?Urinary tract infection with hematuria, site unspecified  ? ? ?Rx / DC Orders ?ED Discharge Orders   ? ?      Ordered  ?  nitrofurantoin, macrocrystal-monohydrate, (MACROBID) 100 MG capsule  2 times daily       ? 08/14/21 2146  ? ?  ?  ? ?  ? ? ?  ?Lucrezia Starch, MD ?08/16/21 1344 ? ?

## 2021-08-14 NOTE — Discharge Instructions (Signed)
Follow-up with your gynecologist or primary care doctor.  Take antibiotic as prescribed for suspected UTI.  If you develop fever, vomiting, abdominal pain or other new concerning symptom, come back here for reassessment. ?

## 2021-08-14 NOTE — ED Triage Notes (Signed)
Pt reports dysuria, cloudy urine with odor.  Started 2 days ago.  ?Decreased appetite ?

## 2021-10-15 ENCOUNTER — Emergency Department (HOSPITAL_COMMUNITY)
Admission: EM | Admit: 2021-10-15 | Discharge: 2021-10-15 | Disposition: A | Discharge status: Home / Self Care | Payer: Self-pay | Attending: Emergency Medicine | Admitting: Emergency Medicine

## 2021-10-15 DIAGNOSIS — Y93G3 Activity, cooking and baking: Secondary | ICD-10-CM | POA: Insufficient documentation

## 2021-10-15 DIAGNOSIS — S61213A Laceration without foreign body of left middle finger without damage to nail, initial encounter: Secondary | ICD-10-CM | POA: Insufficient documentation

## 2021-10-15 DIAGNOSIS — W268XXA Contact with other sharp object(s), not elsewhere classified, initial encounter: Secondary | ICD-10-CM | POA: Insufficient documentation

## 2021-10-15 DIAGNOSIS — Z23 Encounter for immunization: Secondary | ICD-10-CM | POA: Insufficient documentation

## 2021-10-15 MED ORDER — TETANUS-DIPHTH-ACELL PERTUSSIS 5-2.5-18.5 LF-MCG/0.5 IM SUSY
0.5000 mL | PREFILLED_SYRINGE | Freq: Once | INTRAMUSCULAR | Status: AC
  Administered 2021-10-15: 0.5 mL via INTRAMUSCULAR
  Filled 2021-10-15: qty 0.5

## 2021-10-15 NOTE — ED Triage Notes (Signed)
Pt c/o lac to L middle finger while cutting cabage. Bleeding controlled without dressing in triage ?

## 2021-10-15 NOTE — Discharge Instructions (Addendum)
Keep area clean with warm soap and water dab dry and apply a small amount of petroleum jelly/Vaseline.  This will heal well but will likely leave a scar ?

## 2021-10-15 NOTE — ED Provider Notes (Signed)
[facility] EMERGENCY DEPARTMENT Provider Note   CSN: [id number] Arrival date & time: 10/15/21  1433     History  Chief Complaint  Patient presents with   Laceration    Deanna May is a 31 y.o. female.   Laceration 31 year old female present emergency room today no pertinent past medical history states that this morning she cut her left middle finger while cutting cabbage.  She is able to control the bleeding with pressure.  She states that she was here to make sure that it did not need to be repaired.        Home Medications Prior to Admission medications   Medication Sig Start Date End Date Taking? Authorizing Provider  buPROPion (WELLBUTRIN SR) 150 MG 12 hr tablet 1 tab by mouth in the morning for 3 days then increase to twice daily Patient not taking: Reported on 12/23/2018 06/10/18   Julieanne Manson, MD  levonorgestrel (MIRENA) 20 MCG/24HR IUD 1 each by Intrauterine route once.    [provider]  nitrofurantoin, macrocrystal-monohydrate, (MACROBID) 100 MG capsule Take 1 capsule (100 mg total) by mouth 2 (two) times daily. 08/14/21   Milagros Loll, MD      Allergies    Patient has no known allergies.    Review of Systems   Review of Systems  Physical Exam Updated Vital Signs BP 130/63 (BP Location: Left Arm)   Pulse 67   Temp 97.8 F (36.6 C) (Oral)   Resp 16   SpO2 100%  Physical Exam Vitals and nursing note reviewed.  Constitutional:      General: She is not in acute distress.    Appearance: Normal appearance. She is not ill-appearing.  HENT:     Head: Normocephalic and atraumatic.  Eyes:     General: No scleral icterus.       Right eye: No discharge.        Left eye: No discharge.     Conjunctiva/sclera: Conjunctivae normal.  Pulmonary:     Effort: Pulmonary effort is normal.     Breath sounds: No stridor.  Skin:    General: Skin is warm and dry.     Comments: Left middle finger with small skin avulsion  with completely devitalized skin that is connected loosely.  No suturable laceration.  No nail injury  Neurological:     Mental Status: She is alert and oriented to person, place, and time. Mental status is at baseline.    ED Results / Procedures / Treatments   Labs (all labs ordered are listed, but only abnormal results are displayed) Labs Reviewed - No data to display  EKG None  Radiology No results found.  Procedures Procedures    Medications Ordered in ED Medications  Tdap (BOOSTRIX) injection 0.5 mL (has no administration in time range)    ED Course/ Medical Decision Making/ A&P                           Medical Decision Making  31 year old female present emergency room today no pertinent past medical history states that this morning she cut her left middle finger while cutting cabbage.  She is able to control the bleeding with pressure.  She states that she was here to make sure that it did not need to be repaired.   No suturable laceration More of a superficial skin avulsion some of the epidermis has been shaved nearly off but is attached  Updated on  tetanus  No indication for antibiotics   Distally neurovascularly intact.  Final Clinical Impression(s) / ED Diagnoses Final diagnoses:  Laceration of left middle finger without foreign body without damage to nail, initial encounter    Rx / DC Orders ED Discharge Orders     None         Gailen Shelter, [place] 10/15/21 1642    Gwyneth Sprout, MD 10/16/21 [phone]

## 2022-01-04 ENCOUNTER — Inpatient Hospital Stay (HOSPITAL_COMMUNITY)
Admission: AD | Admit: 2022-01-04 | Discharge: 2022-01-04 | Disposition: A | Discharge status: Home / Self Care | Payer: Medicaid Other | Attending: Obstetrics and Gynecology | Admitting: Obstetrics and Gynecology

## 2022-01-04 ENCOUNTER — Inpatient Hospital Stay (HOSPITAL_COMMUNITY): Payer: Medicaid Other

## 2022-01-04 ENCOUNTER — Encounter (HOSPITAL_COMMUNITY): Payer: Self-pay

## 2022-01-04 DIAGNOSIS — Z79899 Other long term (current) drug therapy: Secondary | ICD-10-CM | POA: Insufficient documentation

## 2022-01-04 DIAGNOSIS — R109 Unspecified abdominal pain: Secondary | ICD-10-CM

## 2022-01-04 DIAGNOSIS — R1030 Lower abdominal pain, unspecified: Secondary | ICD-10-CM | POA: Diagnosis present

## 2022-01-04 DIAGNOSIS — Z793 Long term (current) use of hormonal contraceptives: Secondary | ICD-10-CM | POA: Insufficient documentation

## 2022-01-04 DIAGNOSIS — Z3491 Encounter for supervision of normal pregnancy, unspecified, first trimester: Secondary | ICD-10-CM

## 2022-01-04 DIAGNOSIS — O26891 Other specified pregnancy related conditions, first trimester: Secondary | ICD-10-CM | POA: Insufficient documentation

## 2022-01-04 DIAGNOSIS — Z3490 Encounter for supervision of normal pregnancy, unspecified, unspecified trimester: Secondary | ICD-10-CM

## 2022-01-04 DIAGNOSIS — O26899 Other specified pregnancy related conditions, unspecified trimester: Secondary | ICD-10-CM | POA: Diagnosis not present

## 2022-01-04 DIAGNOSIS — Z3A01 Less than 8 weeks gestation of pregnancy: Secondary | ICD-10-CM | POA: Diagnosis not present

## 2022-01-04 LAB — URINALYSIS, ROUTINE W REFLEX MICROSCOPIC
Bilirubin Urine: NEGATIVE
Glucose, UA: NEGATIVE mg/dL
Hgb urine dipstick: NEGATIVE
Ketones, ur: NEGATIVE mg/dL
Leukocytes,Ua: NEGATIVE
Nitrite: NEGATIVE
Protein, ur: NEGATIVE mg/dL
Specific Gravity, Urine: 1.02 (ref 1.005–1.030)
pH: 6.5 (ref 5.0–8.0)

## 2022-01-04 LAB — CBC
HCT: 39.4 % (ref 36.0–46.0)
Hemoglobin: 14.1 g/dL (ref 12.0–15.0)
MCH: 32.2 pg (ref 26.0–34.0)
MCHC: 35.8 g/dL (ref 30.0–36.0)
MCV: 90 fL (ref 80.0–100.0)
Platelets: 325 10*3/uL (ref 150–400)
RBC: 4.38 MIL/uL (ref 3.87–5.11)
RDW: 11.9 % (ref 11.5–15.5)
WBC: 5.9 10*3/uL (ref 4.0–10.5)
nRBC: 0 % (ref 0.0–0.2)

## 2022-01-04 LAB — WET PREP, GENITAL
Sperm: NONE SEEN
Trich, Wet Prep: NONE SEEN
WBC, Wet Prep HPF POC: <10 (ref <10)
Yeast Wet Prep HPF POC: NONE SEEN

## 2022-01-04 LAB — HCG, QUANTITATIVE, PREGNANCY: hCG, Beta Chain, Quant, S: 31122 m[IU]/mL — ABNORMAL HIGH (ref <5)

## 2022-01-04 LAB — HEPATITIS C ANTIBODY: HCV Ab: NONREACTIVE

## 2022-01-04 LAB — POCT PREGNANCY, URINE: Preg Test, Ur: POSITIVE — AB

## 2022-01-04 LAB — HIV ANTIBODY (ROUTINE TESTING W REFLEX): HIV Screen 4th Generation wRfx: NONREACTIVE

## 2022-01-04 LAB — HEPATITIS B SURFACE ANTIGEN: Hepatitis B Surface Ag: NONREACTIVE

## 2022-01-04 MED ORDER — METRONIDAZOLE 0.75 % VA GEL: 1.0000 | Freq: Every day | VAGINAL | 1 refills | Status: DC

## 2022-01-04 NOTE — MAU Provider Note (Signed)
Chief Complaint:  Abdominal Pain   Event Date/Time   First Provider Initiated Contact with Patient 01/04/22 1334     HPI: Deanna May is a 31 y.o. [id number] at [redacted]w[redacted]d who presents to maternity admissions reporting lower abdominal cramping and low back pain that is diffuse and constant x2 days. Is concerned for possible STI given she is in a polyamorous relationship and is unsure of what her female partner does outside their relationship. Denies vaginal bleeding, leaking of fluid, decreased fetal movement, fever, falls, or recent illness.   Pregnancy Course: Has not begun routine OB care, just found out she is pregnant.  Past Medical History:  Diagnosis Date   Depression 2018   OB History  Gravida Para Term Preterm AB Living  6 4 4   1 4   SAB IAB Ectopic Multiple Live Births    1     4    # Outcome Date GA Lbr Len/2nd Weight Sex Delivery Anes PTL Lv  6 Current           5 IAB 2018 [redacted]w[redacted]d         4 Term 09/23/15 [redacted]w[redacted]d    CS-Unspec     3 Term 09/16/12     CS-Unspec     2 Term 10/30/09 [redacted]w[redacted]d    CS-Unspec     1 Term 10/16/07 [redacted]w[redacted]d    CS-Unspec      Past Surgical History:  Procedure Laterality Date   CESAREAN SECTION  2009, 2011, 2014, 2017   Family History  Problem Relation Age of Onset   Autism spectrum disorder Son    Asthma Sister    Allergies Sister    Autism spectrum disorder Son    Social History   Tobacco Use   Smoking status: Never   Smokeless tobacco: Never  Vaping Use   Vaping Use: Never used  Substance Use Topics   Alcohol use: Not Currently   Drug use: Yes    Types: Marijuana    Comment: last used 01/03/2022   No Known Allergies Medications Prior to Admission  Medication Sig Dispense Refill Last Dose   buPROPion (WELLBUTRIN SR) 150 MG 12 hr tablet 1 tab by mouth in the morning for 3 days then increase to twice daily (Patient not taking: Reported on 12/23/2018) 60 tablet 2    levonorgestrel (MIRENA) 20 MCG/24HR IUD 1 each by Intrauterine route once.       nitrofurantoin, macrocrystal-monohydrate, (MACROBID) 100 MG capsule Take 1 capsule (100 mg total) by mouth 2 (two) times daily. 10 capsule 0    I have reviewed patient's Past Medical Hx, Surgical Hx, Family Hx, Social Hx, medications and allergies.   ROS:  Pertinent items noted in HPI and remainder of comprehensive ROS otherwise negative.   Physical Exam  Patient Vitals for the past 24 hrs:  BP Temp Temp src Pulse Resp SpO2 Height Weight  01/04/22 1256 -- -- -- -- -- -- 5' 9.5" (1.765 m) 160 lb 11.2 oz (72.9 kg)  01/04/22 1254 (!) 132/59 98.7 F (37.1 C) Oral 68 15 100 % -- --   Constitutional: Well-developed, well-nourished female in no acute distress.  Cardiovascular: normal rate & rhythm, warm and well-perfused Respiratory: normal effort, no problems with respiration noted GI: Abd soft, non-tender MS: Extremities nontender, no edema, normal ROM Neurologic: Alert and oriented x 4.  GU: no CVA tenderness Pelvic: exam deferred, RN collected swabs and pt sent to ultrasound   Labs: Results for orders placed or performed during the  hospital encounter of 01/04/22 (from the past 24 hour(s))  Pregnancy, urine POC     Status: Abnormal   Collection Time: 01/04/22 12:46 PM  Result Value Ref Range   Preg Test, Ur POSITIVE (A) NEGATIVE  Urinalysis, Routine w reflex microscopic Urine, Clean Catch     Status: None   Collection Time: 01/04/22 12:57 PM  Result Value Ref Range   Color, Urine YELLOW YELLOW   APPearance CLEAR CLEAR   Specific Gravity, Urine 1.020 1.005 - 1.030   pH 6.5 5.0 - 8.0   Glucose, UA NEGATIVE NEGATIVE mg/dL   Hgb urine dipstick NEGATIVE NEGATIVE   Bilirubin Urine NEGATIVE NEGATIVE   Ketones, ur NEGATIVE NEGATIVE mg/dL   Protein, ur NEGATIVE NEGATIVE mg/dL   Nitrite NEGATIVE NEGATIVE   Leukocytes,Ua NEGATIVE NEGATIVE  CBC     Status: None   Collection Time: 01/04/22  1:55 PM  Result Value Ref Range   WBC 5.9 4.0 - 10.5 K/uL   RBC 4.38 3.87 - 5.11 MIL/uL    Hemoglobin 14.1 12.0 - 15.0 g/dL   HCT 81.8 29.9 - 37.1 %   MCV 90.0 80.0 - 100.0 fL   MCH 32.2 26.0 - 34.0 pg   MCHC 35.8 30.0 - 36.0 g/dL   RDW 69.6 78.9 - 38.1 %   Platelets 325 150 - 400 K/uL   nRBC 0.0 0.0 - 0.2 %  ABO/Rh     Status: None   Collection Time: 01/04/22  1:55 PM  Result Value Ref Range   ABO/RH(D)      A POS Performed at [facility], [address] [place], [place] [address]   Wet prep, genital     Status: Abnormal   Collection Time: 01/04/22  2:00 PM   Specimen: PATH Cytology Cervicovaginal Ancillary Only  Result Value Ref Range   Yeast Wet Prep HPF POC NONE SEEN NONE SEEN   Trich, Wet Prep NONE SEEN NONE SEEN   Clue Cells Wet Prep HPF POC PRESENT (A) NONE SEEN   WBC, Wet Prep HPF POC <10 <10   Sperm NONE SEEN    Imaging:  US OB LESS THAN 14 WEEKS WITH OB TRANSVAGINAL  Result Date: 01/04/2022 CLINICAL DATA:  First trimester pregnancy. Abdominal pain for 2 days. LMP 11/21/2021. Beta HCG levels in progress. EXAM: OBSTETRIC <14 WK [place] AND TRANSVAGINAL OB US TECHNIQUE: Both transabdominal and transvaginal ultrasound examinations were performed for complete evaluation of the gestation as well as the maternal uterus, adnexal regions, and pelvic cul-de-sac. Transvaginal technique was performed to assess early pregnancy. COMPARISON:  Pelvic ultrasound 06/27/2018. FINDINGS: Intrauterine gestational sac: Visualized/normal in shape. Yolk sac:  Visualized. Embryo:  Visualized. Cardiac Activity: Visualized. Heart Rate: 126 bpm CRL:  4.3 mm; 6 w 1 d;                  [place] EDC: 08/29/2022 Subchorionic hemorrhage: None. Maternal uterus/adnexae: Both maternal ovaries are visualized. No adnexal mass. Trace free pelvic fluid. Small anterior uterine fibroid noted. IMPRESSION: 1. Single live intrauterine pregnancy with best estimated gestational age of [redacted] weeks 1 day. 2. No evidence of adnexal mass. Electronically Signed   By: Carey Bullocks M.D.   On: 01/04/2022 14:55    MAU  Course: Orders Placed This Encounter  Procedures   Wet prep, genital   US OB LESS THAN 14 WEEKS WITH OB TRANSVAGINAL   Urinalysis, Routine w reflex microscopic Urine, Clean Catch   CBC   hCG, quantitative, pregnancy  RPR   HIV Antibody (routine testing w rflx)   Hepatitis B surface antigen   Hepatitis C antibody   Pregnancy, urine POC   ABO/Rh   No orders of the defined types were placed in this encounter.  MDM: Ectopic workup + STI panel ordered  Reviewed results with patient and sent BV prescription to pharmacy. Encouraged patient to establish routine OB care as soon as possible.  Assessment: Abdominal cramping affecting pregnancy  Intrauterine pregnancy - Plan: Discharge patient  [redacted] weeks gestation of pregnancy  Presence of fetal heart activity in first trimester   Plan: Discharge home in stable condition with first trimester precautions    Allergies as of 01/04/2022   No Known Allergies      Medication List     STOP taking these medications    buPROPion 150 MG 12 hr tablet Commonly known as: WELLBUTRIN SR   levonorgestrel 20 MCG/24HR IUD Commonly known as: MIRENA   nitrofurantoin (macrocrystal-monohydrate) 100 MG capsule Commonly known as: MACROBID       TAKE these medications    metroNIDAZOLE 0.75 % vaginal gel Commonly known as: METROGEL Place 1 Applicatorful vaginally at bedtime. Apply one applicatorful to vagina at bedtime for 5 days        Edd Arbour, CNM, MSN, [employer] Certified Nurse Midwife, [facility]

## 2022-01-04 NOTE — MAU Note (Signed)
Patient reports she is in a poly relationship but is concerned for STD's as she does not know what her significant other does outside of their relationship. Patient also endorsing lower back pain that is constant.

## 2022-01-04 NOTE — MAU Note (Signed)
.  Deanna May is a 31 y.o. at Unknown here in MAU reporting: lower abd pain that started 2 days ago.  Denies vag bleeding.  LMP: 11/21/21 Onset of complaint: 8/4 Pain score: 8 Vitals:   01/04/22 1254  BP: (!) 132/59  Pulse: 68  Resp: 15  Temp: 98.7 F (37.1 C)  SpO2: 100%   Lab orders placed from triage:   ua

## 2022-01-05 LAB — RPR: RPR Ser Ql: NONREACTIVE

## 2022-01-05 LAB — ABO/RH: ABO/RH(D): A POS

## 2022-01-05 LAB — GC/CHLAMYDIA PROBE AMP (~~LOC~~) NOT AT ARMC
Chlamydia: NEGATIVE
Comment: NEGATIVE
Comment: NORMAL
Neisseria Gonorrhea: NEGATIVE

## 2022-01-29 LAB — OB RESULTS CONSOLE HEPATITIS B SURFACE ANTIGEN: Hepatitis B Surface Ag: NEGATIVE

## 2022-01-29 LAB — OB RESULTS CONSOLE RPR: RPR: NONREACTIVE

## 2022-01-29 LAB — OB RESULTS CONSOLE ANTIBODY SCREEN: Antibody Screen: NEGATIVE

## 2022-01-29 LAB — OB RESULTS CONSOLE ABO/RH: RH Type: POSITIVE

## 2022-01-29 LAB — OB RESULTS CONSOLE HIV ANTIBODY (ROUTINE TESTING): HIV: NONREACTIVE

## 2022-01-29 LAB — HEPATITIS C ANTIBODY: HCV Ab: NEGATIVE

## 2022-01-29 LAB — OB RESULTS CONSOLE RUBELLA ANTIBODY, IGM: Rubella: IMMUNE

## 2022-01-30 LAB — OB RESULTS CONSOLE GC/CHLAMYDIA: Neisseria Gonorrhea: NEGATIVE

## 2022-07-15 ENCOUNTER — Inpatient Hospital Stay (HOSPITAL_COMMUNITY)
Admission: AD | Admit: 2022-07-15 | Discharge: 2022-07-15 | Disposition: A | Discharge status: Home / Self Care | Payer: Medicaid Other | Attending: Obstetrics & Gynecology | Admitting: Obstetrics & Gynecology

## 2022-07-15 ENCOUNTER — Other Ambulatory Visit: Payer: Self-pay

## 2022-07-15 ENCOUNTER — Encounter (HOSPITAL_COMMUNITY): Payer: Self-pay | Admitting: Obstetrics & Gynecology

## 2022-07-15 DIAGNOSIS — Z3689 Encounter for other specified antenatal screening: Secondary | ICD-10-CM

## 2022-07-15 DIAGNOSIS — O368130 Decreased fetal movements, third trimester, not applicable or unspecified: Secondary | ICD-10-CM | POA: Insufficient documentation

## 2022-07-15 DIAGNOSIS — O26893 Other specified pregnancy related conditions, third trimester: Secondary | ICD-10-CM | POA: Diagnosis present

## 2022-07-15 DIAGNOSIS — O4693 Antepartum hemorrhage, unspecified, third trimester: Secondary | ICD-10-CM | POA: Insufficient documentation

## 2022-07-15 DIAGNOSIS — Z3493 Encounter for supervision of normal pregnancy, unspecified, third trimester: Secondary | ICD-10-CM

## 2022-07-15 DIAGNOSIS — O479 False labor, unspecified: Secondary | ICD-10-CM

## 2022-07-15 DIAGNOSIS — O98813 Other maternal infectious and parasitic diseases complicating pregnancy, third trimester: Secondary | ICD-10-CM | POA: Diagnosis not present

## 2022-07-15 DIAGNOSIS — Z3A33 33 weeks gestation of pregnancy: Secondary | ICD-10-CM | POA: Diagnosis not present

## 2022-07-15 DIAGNOSIS — R109 Unspecified abdominal pain: Secondary | ICD-10-CM | POA: Diagnosis not present

## 2022-07-15 DIAGNOSIS — B3731 Acute candidiasis of vulva and vagina: Secondary | ICD-10-CM | POA: Insufficient documentation

## 2022-07-15 LAB — URINALYSIS, ROUTINE W REFLEX MICROSCOPIC
Bilirubin Urine: NEGATIVE
Glucose, UA: NEGATIVE mg/dL
Hgb urine dipstick: NEGATIVE
Ketones, ur: NEGATIVE mg/dL
Nitrite: NEGATIVE
Protein, ur: 30 mg/dL — AB
Specific Gravity, Urine: 1.021 (ref 1.005–1.030)
pH: 6 (ref 5.0–8.0)

## 2022-07-15 LAB — POCT FERN TEST: POCT Fern Test: NEGATIVE

## 2022-07-15 LAB — AMNISURE RUPTURE OF MEMBRANE (ROM) NOT AT ARMC: Amnisure ROM: NEGATIVE

## 2022-07-15 MED ORDER — NIFEDIPINE 10 MG PO CAPS
10.0000 mg | ORAL_CAPSULE | ORAL | Status: DC | PRN
  Administered 2022-07-15 (×2): 10 mg via ORAL
  Filled 2022-07-15 (×2): qty 1

## 2022-07-15 MED ORDER — ACETAMINOPHEN 500 MG PO TABS
1000.0000 mg | ORAL_TABLET | Freq: Once | ORAL | Status: AC
Start: 2022-07-15 — End: 2022-07-15
  Administered 2022-07-15: 1000 mg via ORAL
  Filled 2022-07-15: qty 2

## 2022-07-15 MED ORDER — LACTATED RINGERS IV BOLUS
1000.0000 mL | Freq: Once | INTRAVENOUS | Status: AC
  Administered 2022-07-15: 1000 mL via INTRAVENOUS

## 2022-07-15 MED ORDER — CYCLOBENZAPRINE HCL 5 MG PO TABS
10.0000 mg | ORAL_TABLET | Freq: Once | ORAL | Status: AC
  Administered 2022-07-15: 10 mg via ORAL
  Filled 2022-07-15: qty 2

## 2022-07-15 NOTE — MAU Note (Signed)
Deanna May is a 32 y.o. at 65w5dhere in MAU reporting: for the past 1-2 days has been having cramping and some pelvic pressure. This AM noticed some pink discharge. No LOF. DFM.  Onset of complaint: ongoing  Pain score: abdominal pain 5/10  Vitals:   07/15/22 1124  BP: 138/65  Pulse: 95  Resp: 16  Temp: 98.6 F (37 C)  SpO2: 100%     FHT:143  Lab orders placed from triage: UA

## 2022-07-15 NOTE — MAU Provider Note (Signed)
History     CSN: [id number]  Arrival date and time: 07/15/22 1110   Event Date/Time   First Provider Initiated Contact with Patient 07/15/22 1144      Chief Complaint  Patient presents with   Decreased Fetal Movement   Abdominal Pain   Vaginal Bleeding   HPI Deanna May is a 32 y.o. [id number] at 63w5dwho presents to MAU with chief complaint of abdominal pain. This is a new problem, onset 1-2 days ago. Pain is located in her mid and lower abdomen. Pain score is 5/10. She denies aggravating or alleviating factors. She has not taken medication for this complaint.  Patient also reports one episode of pink-tinged discharge earlier today. She is not sure about ongoing leaking because she immediately presented to MAU. She is currently administering Terazol cream qhs for vulvovaginal candidiasis. She is remote from sexual intercourse.   Patient also reports DFM earlier today but endorses fetal movement on arrival to MAU.   Ob history: Term cesarean x 4. She receives care with [facility].  OB History     Gravida  6   Para  4   Term  4   Preterm      AB  1   Living  4      SAB      IAB  1   Ectopic      Multiple      Live Births  4           Past Medical History:  Diagnosis Date   Depression 2018    Past Surgical History:  Procedure Laterality Date   CESAREAN SECTION  2009, 2011, 2014, 2017    Family History  Problem Relation Age of Onset   Autism spectrum disorder Son    Asthma Sister    Allergies Sister    Autism spectrum disorder Son     Social History   Tobacco Use   Smoking status: Never   Smokeless tobacco: Never  Vaping Use   Vaping Use: Never used  Substance Use Topics   Alcohol use: Not Currently   Drug use: Not Currently    Types: Marijuana    Comment: last used 01/03/2022    Allergies: No Known Allergies  Medications Prior to Admission  Medication Sig Dispense Refill Last Dose   Prenatal Vit-Fe Fumarate-FA (PRENATAL  MULTIVITAMIN) TABS tablet Take 1 tablet by mouth daily at 12 noon.   Past Month   terconazole (TERAZOL 3) 0.8 % vaginal cream Place 1 applicator vaginally at bedtime.   07/14/2022   metroNIDAZOLE (METROGEL) 0.75 % vaginal gel Place 1 Applicatorful vaginally at bedtime. Apply one applicatorful to vagina at bedtime for 5 days 70 g 1     Review of Systems  Gastrointestinal:  Positive for abdominal pain.  Genitourinary:  Positive for vaginal discharge.  All other systems reviewed and are negative.  Physical Exam   Blood pressure 123/70, pulse 92, temperature 98.6 F (37 C), temperature source Oral, resp. rate 16, height 5' 9.5" (1.765 m), weight 85.6 kg, last menstrual period 11/21/2021, SpO2 100 %.  Physical Exam Vitals and nursing note reviewed. Exam conducted with a chaperone present.  Constitutional:      Appearance: She is well-developed. She is not ill-appearing.  Cardiovascular:     Rate and Rhythm: Normal rate.  Pulmonary:     Effort: Pulmonary effort is normal.  Abdominal:     Comments: Gravid  Genitourinary:    Comments: Pelvic exam: External genitalia normal, vaginal  walls pink and well rugated, cervix visually closed, no lesions noted. Thick white matter c/w prescribed Terazol cream.   Skin:    General: Skin is warm and dry.  Neurological:     Mental Status: She is alert.     MAU Course  Procedures  MDM --Reactive tracing: baseline 135, mod var, + accels, no decels --Toco: initially q 3-5 min, resolved with IV fluid bolus --1242. Per RN update patient now denies contractions, pain score zero. Will hold 3rd dose of procardia --Cervix visually closed on initial speculum exam. Confirmed with digital exam (closed/thick/ballotable) prior to d/c  Orders Placed This Encounter  Procedures   Urinalysis, Routine w reflex microscopic -Urine, Clean Catch   Amnisure rupture of membrane (rom)not at [place]   Discharge patient   Patient Vitals for the past 24  hrs:  BP Temp Temp src Pulse Resp SpO2 Height Weight  07/15/22 1319 (!) 110/54 -- -- 94 -- -- -- --  07/15/22 1220 123/70 -- -- -- -- -- -- --  07/15/22 1208 119/65 -- -- 92 -- -- -- --  07/15/22 1142 128/62 -- -- 81 -- -- -- --  07/15/22 1124 138/65 98.6 F (37 C) Oral 95 16 100 % -- --  07/15/22 1120 -- -- -- -- -- -- 5' 9.5" (1.765 m) 85.6 kg   Results for orders placed or performed during the hospital encounter of 07/15/22 (from the past 24 hour(s))  Fern Test     Status: None   Collection Time: 07/15/22 12:00 PM  Result Value Ref Range   POCT Fern Test Negative = intact amniotic membranes   Amnisure rupture of membrane (rom)not at [facility]     Status: None   Collection Time: 07/15/22 12:32 PM  Result Value Ref Range   Amnisure ROM NEGATIVE   Urinalysis, Routine w reflex microscopic -Urine, Clean Catch     Status: Abnormal   Collection Time: 07/15/22 12:48 PM  Result Value Ref Range   Color, Urine YELLOW YELLOW   APPearance HAZY (A) CLEAR   Specific Gravity, Urine 1.021 1.005 - 1.030   pH 6.0 5.0 - 8.0   Glucose, UA NEGATIVE NEGATIVE mg/dL   Hgb urine dipstick NEGATIVE NEGATIVE   Bilirubin Urine NEGATIVE NEGATIVE   Ketones, ur NEGATIVE NEGATIVE mg/dL   Protein, ur 30 (A) NEGATIVE mg/dL   Nitrite NEGATIVE NEGATIVE   Leukocytes,Ua TRACE (A) NEGATIVE   RBC / HPF 0-5 0 - 5 RBC/hpf   WBC, UA 11-20 0 - 5 WBC/hpf   Bacteria, UA RARE (A) NONE SEEN   Squamous Epithelial / HPF 6-10 0 - 5 /HPF   Mucus PRESENT    Meds ordered this encounter  Medications   acetaminophen (TYLENOL) tablet 1,000 mg   cyclobenzaprine (FLEXERIL) tablet 10 mg   DISCONTD: NIFEdipine (PROCARDIA) capsule 10 mg   lactated ringers bolus 1,000 mL   Assessment and Plan  -32 y.o. [id number] at [redacted]w[redacted]d --Reactive tracing --Intact amniotic sac --Closed cervix --Pain score 0/10 prior to discharge --Discharge home in stable condition  SDarlina Rumpf MSA, MSN, CNM 07/15/2022, 6:46 PM

## 2022-08-07 ENCOUNTER — Encounter (HOSPITAL_COMMUNITY): Payer: Self-pay

## 2022-08-07 ENCOUNTER — Telehealth (HOSPITAL_COMMUNITY): Payer: Self-pay | Admitting: *Deleted

## 2022-08-07 NOTE — Telephone Encounter (Signed)
Preadmission screen  

## 2022-08-07 NOTE — Patient Instructions (Signed)
Deanna May  08/07/2022   Your procedure is scheduled on:  08/21/2022  Arrive at 67 at Entrance C on [address] at [facility]  and [employer] [employer] [address]. You are invited to use the FREE valet parking or use the Visitor's parking deck.  Pick up the phone at the desk and dial [phone].  Call this number if you have problems the morning of surgery: [phone]  Remember:   Do not eat food:(After Midnight) Desps de medianoche.  Do not drink clear liquids: (After Midnight) Desps de medianoche.  Take these medicines the morning of surgery with A SIP OF WATER:  none   Do not wear jewelry, make-up or nail polish.  Do not wear lotions, powders, or perfumes. Do not wear deodorant.  Do not shave 48 hours prior to surgery.  Do not bring valuables to the hospital.  [facility] is not   responsible for any belongings or valuables brought to the hospital.  Contacts, dentures or bridgework may not be worn into surgery.  Leave suitcase in the car. After surgery it may be brought to your room.  For patients admitted to the hospital, checkout time is 11:00 AM the day of              discharge.      Please read over the following fact sheets that you were given:     Preparing for Surgery

## 2022-08-11 ENCOUNTER — Telehealth (HOSPITAL_COMMUNITY): Payer: Self-pay | Admitting: *Deleted

## 2022-08-11 NOTE — Telephone Encounter (Signed)
Preadmission screen  

## 2022-08-13 ENCOUNTER — Encounter (HOSPITAL_COMMUNITY): Payer: Self-pay | Admitting: Obstetrics and Gynecology

## 2022-08-13 ENCOUNTER — Telehealth (HOSPITAL_COMMUNITY): Payer: Self-pay | Admitting: *Deleted

## 2022-08-13 ENCOUNTER — Inpatient Hospital Stay (HOSPITAL_COMMUNITY)
Admission: RE | Admit: 2022-08-13 | Discharge: 2022-08-16 | DRG: 787 | Disposition: A | Discharge status: Home / Self Care | Payer: Medicaid Other | Attending: Obstetrics and Gynecology | Admitting: Obstetrics and Gynecology

## 2022-08-13 DIAGNOSIS — O479 False labor, unspecified: Secondary | ICD-10-CM

## 2022-08-13 DIAGNOSIS — Z98891 History of uterine scar from previous surgery: Secondary | ICD-10-CM

## 2022-08-13 DIAGNOSIS — Z3A38 38 weeks gestation of pregnancy: Secondary | ICD-10-CM

## 2022-08-13 DIAGNOSIS — O9902 Anemia complicating childbirth: Secondary | ICD-10-CM | POA: Diagnosis present

## 2022-08-13 DIAGNOSIS — O34219 Maternal care for unspecified type scar from previous cesarean delivery: Principal | ICD-10-CM | POA: Diagnosis present

## 2022-08-13 DIAGNOSIS — O99824 Streptococcus B carrier state complicating childbirth: Secondary | ICD-10-CM | POA: Diagnosis present

## 2022-08-13 DIAGNOSIS — D62 Acute posthemorrhagic anemia: Secondary | ICD-10-CM | POA: Diagnosis not present

## 2022-08-13 DIAGNOSIS — Z8659 Personal history of other mental and behavioral disorders: Secondary | ICD-10-CM

## 2022-08-13 NOTE — MAU Note (Addendum)
.  Deanna May is a 32 y.o. at [redacted]w[redacted]d here in MAU reporting ctxs all day but more consistent since 1700. Denies LOF or VB. Good FM. For repeat c/s on 3/22. After triage pt mentioned she has a h/a but thinks due to not eating since 12n.  Onset of complaint: 1700 Pain score: 7 Vitals:   08/13/22 2148 08/13/22 2150  BP:  (!) 117/57  Pulse: 83   Resp: 17   Temp: 98.5 F (36.9 C)   SpO2: 100%      FHT:140 Lab orders placed from triage:  mau labor eval

## 2022-08-13 NOTE — Telephone Encounter (Signed)
Preadmission screen  

## 2022-08-14 ENCOUNTER — Other Ambulatory Visit: Payer: Self-pay

## 2022-08-14 ENCOUNTER — Inpatient Hospital Stay (HOSPITAL_COMMUNITY): Payer: Medicaid Other | Admitting: Anesthesiology

## 2022-08-14 ENCOUNTER — Encounter (HOSPITAL_COMMUNITY): Payer: Self-pay | Admitting: Obstetrics and Gynecology

## 2022-08-14 ENCOUNTER — Encounter (HOSPITAL_COMMUNITY)
Admission: RE | Disposition: A | Discharge status: Home / Self Care | Payer: Self-pay | Source: Home / Self Care | Attending: Obstetrics and Gynecology

## 2022-08-14 DIAGNOSIS — O34219 Maternal care for unspecified type scar from previous cesarean delivery: Secondary | ICD-10-CM | POA: Diagnosis present

## 2022-08-14 DIAGNOSIS — D649 Anemia, unspecified: Secondary | ICD-10-CM

## 2022-08-14 DIAGNOSIS — D62 Acute posthemorrhagic anemia: Secondary | ICD-10-CM | POA: Diagnosis not present

## 2022-08-14 DIAGNOSIS — O9902 Anemia complicating childbirth: Secondary | ICD-10-CM

## 2022-08-14 DIAGNOSIS — Z3A38 38 weeks gestation of pregnancy: Secondary | ICD-10-CM | POA: Diagnosis not present

## 2022-08-14 DIAGNOSIS — O479 False labor, unspecified: Secondary | ICD-10-CM | POA: Diagnosis not present

## 2022-08-14 DIAGNOSIS — O99820 Streptococcus B carrier state complicating pregnancy: Secondary | ICD-10-CM | POA: Diagnosis not present

## 2022-08-14 DIAGNOSIS — O34211 Maternal care for low transverse scar from previous cesarean delivery: Secondary | ICD-10-CM | POA: Diagnosis not present

## 2022-08-14 DIAGNOSIS — O99824 Streptococcus B carrier state complicating childbirth: Secondary | ICD-10-CM | POA: Diagnosis present

## 2022-08-14 DIAGNOSIS — Z98891 History of uterine scar from previous surgery: Secondary | ICD-10-CM

## 2022-08-14 LAB — CBC
HCT: 32.4 % — ABNORMAL LOW (ref 36.0–46.0)
HCT: 28.4 % — ABNORMAL LOW (ref 36.0–46.0)
Hemoglobin: 10.5 g/dL — ABNORMAL LOW (ref 12.0–15.0)
Hemoglobin: 9.3 g/dL — ABNORMAL LOW (ref 12.0–15.0)
MCH: 29.2 pg (ref 26.0–34.0)
MCH: 29.4 pg (ref 26.0–34.0)
MCHC: 32.4 g/dL (ref 30.0–36.0)
MCHC: 32.7 g/dL (ref 30.0–36.0)
MCV: 90.3 fL (ref 80.0–100.0)
MCV: 89.9 fL (ref 80.0–100.0)
Platelets: 307 10*3/uL (ref 150–400)
Platelets: 247 10*3/uL (ref 150–400)
RBC: 3.59 MIL/uL — ABNORMAL LOW (ref 3.87–5.11)
RBC: 3.16 MIL/uL — ABNORMAL LOW (ref 3.87–5.11)
RDW: 12.9 % (ref 11.5–15.5)
RDW: 13 % (ref 11.5–15.5)
WBC: 8.9 10*3/uL (ref 4.0–10.5)
WBC: 5.8 10*3/uL (ref 4.0–10.5)
nRBC: 0 % (ref 0.0–0.2)
nRBC: 0 % (ref 0.0–0.2)

## 2022-08-14 LAB — GC/CHLAMYDIA PROBE AMP (~~LOC~~) NOT AT ARMC
Chlamydia: NEGATIVE
Comment: NEGATIVE
Comment: NORMAL
Neisseria Gonorrhea: NEGATIVE

## 2022-08-14 LAB — WET PREP, GENITAL
Clue Cells Wet Prep HPF POC: NONE SEEN
Sperm: NONE SEEN
Trich, Wet Prep: NONE SEEN
WBC, Wet Prep HPF POC: <10 (ref <10)
Yeast Wet Prep HPF POC: NONE SEEN

## 2022-08-14 LAB — RPR: RPR Ser Ql: NONREACTIVE

## 2022-08-14 LAB — PREPARE RBC (CROSSMATCH)

## 2022-08-14 SURGERY — Surgical Case
Anesthesia: Spinal

## 2022-08-14 MED ORDER — FENTANYL CITRATE (PF) 100 MCG/2ML IJ SOLN
INTRAMUSCULAR | Status: AC
  Filled 2022-08-14: qty 2

## 2022-08-14 MED ORDER — NALOXONE HCL 0.4 MG/ML IJ SOLN: 0.4000 mg | INTRAMUSCULAR | Status: DC | PRN

## 2022-08-14 MED ORDER — ACETAMINOPHEN 500 MG PO TABS
1000.0000 mg | ORAL_TABLET | Freq: Four times a day (QID) | ORAL | Status: DC
  Administered 2022-08-14 – 2022-08-16 (×7): 1000 mg via ORAL
  Filled 2022-08-14 (×7): qty 2

## 2022-08-14 MED ORDER — DEXAMETHASONE SODIUM PHOSPHATE 10 MG/ML IJ SOLN
INTRAMUSCULAR | Status: AC
  Filled 2022-08-14: qty 1

## 2022-08-14 MED ORDER — OXYCODONE HCL 5 MG PO TABS: 5.0000 mg | ORAL_TABLET | Freq: Once | ORAL | Status: DC | PRN

## 2022-08-14 MED ORDER — DIPHENHYDRAMINE HCL 25 MG PO CAPS: 25.0000 mg | ORAL_CAPSULE | Freq: Four times a day (QID) | ORAL | Status: DC | PRN

## 2022-08-14 MED ORDER — ACETAMINOPHEN 10 MG/ML IV SOLN
INTRAVENOUS | Status: AC
  Filled 2022-08-14: qty 300

## 2022-08-14 MED ORDER — ATROPINE SULFATE 0.4 MG/ML IV SOLN
INTRAVENOUS | Status: AC
  Filled 2022-08-14: qty 1

## 2022-08-14 MED ORDER — SIMETHICONE 80 MG PO CHEW
80.0000 mg | CHEWABLE_TABLET | Freq: Three times a day (TID) | ORAL | Status: DC
  Administered 2022-08-15 (×3): 80 mg via ORAL
  Filled 2022-08-14 (×3): qty 1

## 2022-08-14 MED ORDER — DIBUCAINE (PERIANAL) 1 % EX OINT: 1.0000 | TOPICAL_OINTMENT | CUTANEOUS | Status: DC | PRN

## 2022-08-14 MED ORDER — WITCH HAZEL-GLYCERIN EX PADS: 1.0000 | MEDICATED_PAD | CUTANEOUS | Status: DC | PRN

## 2022-08-14 MED ORDER — OXYCODONE HCL 5 MG PO TABS: 5.0000 mg | ORAL_TABLET | ORAL | Status: DC | PRN

## 2022-08-14 MED ORDER — SODIUM CHLORIDE 0.9 % IV SOLN: 500.0000 mg | INTRAVENOUS | Status: DC

## 2022-08-14 MED ORDER — ONDANSETRON HCL 4 MG/2ML IJ SOLN
INTRAMUSCULAR | Status: AC
  Filled 2022-08-14: qty 2

## 2022-08-14 MED ORDER — TERBUTALINE SULFATE 1 MG/ML IJ SOLN
0.2500 mg | Freq: Once | INTRAMUSCULAR | Status: AC
  Administered 2022-08-14: 0.25 mg via SUBCUTANEOUS
  Filled 2022-08-14: qty 1

## 2022-08-14 MED ORDER — TETANUS-DIPHTH-ACELL PERTUSSIS 5-2.5-18.5 LF-MCG/0.5 IM SUSY: 0.5000 mL | PREFILLED_SYRINGE | Freq: Once | INTRAMUSCULAR | Status: DC

## 2022-08-14 MED ORDER — LACTATED RINGERS IV SOLN: INTRAVENOUS | Status: DC

## 2022-08-14 MED ORDER — FENTANYL CITRATE (PF) 100 MCG/2ML IJ SOLN
INTRAMUSCULAR | Status: DC | PRN
  Administered 2022-08-14: 15 ug via INTRATHECAL

## 2022-08-14 MED ORDER — SCOPOLAMINE 1 MG/3DAYS TD PT72
1.0000 | MEDICATED_PATCH | Freq: Once | TRANSDERMAL | Status: DC
  Administered 2022-08-14: 1.5 mg via TRANSDERMAL

## 2022-08-14 MED ORDER — IBUPROFEN 600 MG PO TABS
600.0000 mg | ORAL_TABLET | Freq: Four times a day (QID) | ORAL | Status: DC
  Administered 2022-08-15 – 2022-08-16 (×4): 600 mg via ORAL
  Filled 2022-08-14 (×4): qty 1

## 2022-08-14 MED ORDER — KETOROLAC TROMETHAMINE 30 MG/ML IJ SOLN
30.0000 mg | Freq: Four times a day (QID) | INTRAMUSCULAR | Status: AC
  Administered 2022-08-14 – 2022-08-15 (×3): 30 mg via INTRAVENOUS
  Filled 2022-08-14 (×3): qty 1

## 2022-08-14 MED ORDER — CALCIUM CARBONATE ANTACID 500 MG PO CHEW: 2.0000 | CHEWABLE_TABLET | ORAL | Status: DC | PRN

## 2022-08-14 MED ORDER — KETOROLAC TROMETHAMINE 30 MG/ML IJ SOLN
INTRAMUSCULAR | Status: AC
  Filled 2022-08-14: qty 1

## 2022-08-14 MED ORDER — PHENYLEPHRINE HCL (PRESSORS) 10 MG/ML IV SOLN
INTRAVENOUS | Status: DC | PRN
  Administered 2022-08-14: 80 ug via INTRAVENOUS

## 2022-08-14 MED ORDER — PHENYLEPHRINE 80 MCG/ML (10ML) SYRINGE FOR IV PUSH (FOR BLOOD PRESSURE SUPPORT)
PREFILLED_SYRINGE | INTRAVENOUS | Status: AC
  Filled 2022-08-14: qty 40

## 2022-08-14 MED ORDER — NALOXONE HCL 4 MG/10ML IJ SOLN: 1.0000 ug/kg/h | INTRAVENOUS | Status: DC | PRN

## 2022-08-14 MED ORDER — CEFAZOLIN SODIUM-DEXTROSE 2-4 GM/100ML-% IV SOLN
2.0000 g | INTRAVENOUS | Status: AC
  Administered 2022-08-14: 2 g via INTRAVENOUS
  Filled 2022-08-14: qty 100

## 2022-08-14 MED ORDER — OXYCODONE HCL 5 MG/5ML PO SOLN: 5.0000 mg | Freq: Once | ORAL | Status: DC | PRN

## 2022-08-14 MED ORDER — SIMETHICONE 80 MG PO CHEW: 80.0000 mg | CHEWABLE_TABLET | ORAL | Status: DC | PRN

## 2022-08-14 MED ORDER — BUPIVACAINE IN DEXTROSE 0.75-8.25 % IT SOLN
INTRATHECAL | Status: DC | PRN
  Administered 2022-08-14: 1.6 mL via INTRATHECAL

## 2022-08-14 MED ORDER — TRANEXAMIC ACID-NACL 1000-0.7 MG/100ML-% IV SOLN
INTRAVENOUS | Status: AC
  Filled 2022-08-14: qty 200

## 2022-08-14 MED ORDER — DIPHENHYDRAMINE HCL 50 MG/ML IJ SOLN
12.5000 mg | INTRAMUSCULAR | Status: DC | PRN
  Administered 2022-08-15: 12.5 mg via INTRAVENOUS
  Filled 2022-08-14: qty 1

## 2022-08-14 MED ORDER — ONDANSETRON HCL 4 MG/2ML IJ SOLN
INTRAMUSCULAR | Status: DC | PRN
  Administered 2022-08-14: 4 mg via INTRAVENOUS

## 2022-08-14 MED ORDER — FENTANYL CITRATE (PF) 100 MCG/2ML IJ SOLN: 50.0000 ug | INTRAMUSCULAR | Status: DC | PRN

## 2022-08-14 MED ORDER — MORPHINE SULFATE (PF) 0.5 MG/ML IJ SOLN
INTRAMUSCULAR | Status: DC | PRN
  Administered 2022-08-14: .15 mg via INTRATHECAL

## 2022-08-14 MED ORDER — OXYTOCIN-SODIUM CHLORIDE 30-0.9 UT/500ML-% IV SOLN
INTRAVENOUS | Status: DC | PRN
  Administered 2022-08-14: 300 mL via INTRAVENOUS

## 2022-08-14 MED ORDER — OXYTOCIN-SODIUM CHLORIDE 30-0.9 UT/500ML-% IV SOLN
INTRAVENOUS | Status: AC
  Filled 2022-08-14: qty 1000

## 2022-08-14 MED ORDER — PHENYLEPHRINE HCL-NACL 20-0.9 MG/250ML-% IV SOLN
INTRAVENOUS | Status: DC | PRN
  Administered 2022-08-14: 60 ug/min via INTRAVENOUS

## 2022-08-14 MED ORDER — SODIUM CHLORIDE 0.9 % IV SOLN
25.0000 mg | Freq: Three times a day (TID) | INTRAVENOUS | Status: DC | PRN
  Administered 2022-08-14: 25 mg via INTRAVENOUS
  Filled 2022-08-14: qty 1

## 2022-08-14 MED ORDER — SCOPOLAMINE 1 MG/3DAYS TD PT72
MEDICATED_PATCH | TRANSDERMAL | Status: AC
  Filled 2022-08-14: qty 1

## 2022-08-14 MED ORDER — KETOROLAC TROMETHAMINE 30 MG/ML IJ SOLN: 30.0000 mg | Freq: Four times a day (QID) | INTRAMUSCULAR | Status: DC | PRN

## 2022-08-14 MED ORDER — MORPHINE SULFATE (PF) 0.5 MG/ML IJ SOLN
INTRAMUSCULAR | Status: AC
  Filled 2022-08-14: qty 10

## 2022-08-14 MED ORDER — FENTANYL CITRATE (PF) 100 MCG/2ML IJ SOLN
25.0000 ug | INTRAMUSCULAR | Status: DC | PRN
  Administered 2022-08-14: 50 ug via INTRAVENOUS

## 2022-08-14 MED ORDER — OXYTOCIN-SODIUM CHLORIDE 30-0.9 UT/500ML-% IV SOLN
2.5000 [IU]/h | INTRAVENOUS | Status: AC
  Administered 2022-08-14: 2.5 [IU]/h via INTRAVENOUS
  Filled 2022-08-14: qty 500

## 2022-08-14 MED ORDER — MENTHOL 3 MG MT LOZG: 1.0000 | LOZENGE | OROMUCOSAL | Status: DC | PRN

## 2022-08-14 MED ORDER — ZOLPIDEM TARTRATE 5 MG PO TABS: 5.0000 mg | ORAL_TABLET | Freq: Every evening | ORAL | Status: DC | PRN

## 2022-08-14 MED ORDER — ONDANSETRON HCL 4 MG/2ML IJ SOLN: 4.0000 mg | Freq: Three times a day (TID) | INTRAMUSCULAR | Status: DC | PRN

## 2022-08-14 MED ORDER — KETOROLAC TROMETHAMINE 30 MG/ML IJ SOLN
30.0000 mg | Freq: Four times a day (QID) | INTRAMUSCULAR | Status: DC | PRN
  Administered 2022-08-14: 30 mg via INTRAVENOUS

## 2022-08-14 MED ORDER — MEPERIDINE HCL 25 MG/ML IJ SOLN: 6.2500 mg | INTRAMUSCULAR | Status: DC | PRN

## 2022-08-14 MED ORDER — METOCLOPRAMIDE HCL 5 MG/ML IJ SOLN
10.0000 mg | Freq: Once | INTRAMUSCULAR | Status: AC
  Administered 2022-08-14: 10 mg via INTRAVENOUS
  Filled 2022-08-14: qty 2

## 2022-08-14 MED ORDER — SENNOSIDES-DOCUSATE SODIUM 8.6-50 MG PO TABS
2.0000 | ORAL_TABLET | Freq: Every day | ORAL | Status: DC
  Administered 2022-08-15 – 2022-08-16 (×2): 2 via ORAL
  Filled 2022-08-14 (×2): qty 2

## 2022-08-14 MED ORDER — LACTATED RINGERS IV BOLUS
1000.0000 mL | Freq: Once | INTRAVENOUS | Status: AC
  Administered 2022-08-15: 1000 mL via INTRAVENOUS

## 2022-08-14 MED ORDER — COCONUT OIL OIL: 1.0000 | TOPICAL_OIL | Status: DC | PRN

## 2022-08-14 MED ORDER — LACTATED RINGERS IV BOLUS: 1000.0000 mL | Freq: Once | INTRAVENOUS | Status: DC

## 2022-08-14 MED ORDER — SOD CITRATE-CITRIC ACID 500-334 MG/5ML PO SOLN
30.0000 mL | ORAL | Status: AC
  Administered 2022-08-14: 30 mL via ORAL
  Filled 2022-08-14: qty 30

## 2022-08-14 MED ORDER — DEXAMETHASONE SODIUM PHOSPHATE 10 MG/ML IJ SOLN
INTRAMUSCULAR | Status: DC | PRN
  Administered 2022-08-14: 10 mg via INTRAVENOUS

## 2022-08-14 MED ORDER — DIPHENHYDRAMINE HCL 25 MG PO CAPS: 25.0000 mg | ORAL_CAPSULE | ORAL | Status: DC | PRN

## 2022-08-14 MED ORDER — SODIUM CHLORIDE 0.9% FLUSH: 3.0000 mL | INTRAVENOUS | Status: DC | PRN

## 2022-08-14 MED ORDER — LACTATED RINGERS IV SOLN: 125.0000 mL/h | INTRAVENOUS | Status: DC

## 2022-08-14 MED ORDER — ATROPINE SULFATE 0.4 MG/ML IV SOLN
INTRAVENOUS | Status: DC | PRN
  Administered 2022-08-14: .4 mg via INTRAVENOUS

## 2022-08-14 MED ORDER — PRENATAL MULTIVITAMIN CH
1.0000 | ORAL_TABLET | Freq: Every day | ORAL | Status: DC
  Administered 2022-08-15 – 2022-08-16 (×2): 1 via ORAL
  Filled 2022-08-14 (×2): qty 1

## 2022-08-14 MED ORDER — PROMETHAZINE HCL 25 MG/ML IJ SOLN: 6.2500 mg | INTRAMUSCULAR | Status: DC | PRN

## 2022-08-14 MED ORDER — ACETAMINOPHEN 10 MG/ML IV SOLN
INTRAVENOUS | Status: DC | PRN
  Administered 2022-08-14: 1000 mg via INTRAVENOUS

## 2022-08-14 MED ORDER — LACTATED RINGERS IV BOLUS
1000.0000 mL | Freq: Once | INTRAVENOUS | Status: AC
  Administered 2022-08-14: 1000 mL via INTRAVENOUS

## 2022-08-14 MED ORDER — TRANEXAMIC ACID-NACL 1000-0.7 MG/100ML-% IV SOLN
INTRAVENOUS | Status: DC | PRN
  Administered 2022-08-14: 1000 mg via INTRAVENOUS

## 2022-08-14 MED ORDER — ACETAMINOPHEN 325 MG PO TABS
650.0000 mg | ORAL_TABLET | ORAL | Status: DC | PRN
  Administered 2022-08-14: 650 mg via ORAL
  Filled 2022-08-14: qty 2

## 2022-08-14 MED ORDER — DEXAMETHASONE SODIUM PHOSPHATE 10 MG/ML IJ SOLN
INTRAMUSCULAR | Status: AC
  Filled 2022-08-14: qty 2

## 2022-08-14 SURGICAL SUPPLY — 38 items
APL PRP STRL LF DISP 70% ISPRP (MISCELLANEOUS) ×2
APL SKNCLS STERI-STRIP NONHPOA (GAUZE/BANDAGES/DRESSINGS) ×1
BARRIER ADHS 3X4 INTERCEED (GAUZE/BANDAGES/DRESSINGS) IMPLANT
BENZOIN TINCTURE PRP APPL 2/3 (GAUZE/BANDAGES/DRESSINGS) ×1 IMPLANT
BRR ADH 4X3 ABS CNTRL BYND (GAUZE/BANDAGES/DRESSINGS) ×1
CHLORAPREP W/TINT 26 (MISCELLANEOUS) ×2 IMPLANT
CLAMP UMBILICAL CORD (MISCELLANEOUS) ×1 IMPLANT
CLOTH BEACON ORANGE TIMEOUT ST (SAFETY) ×1 IMPLANT
DRAIN JACKSON PRT FLT 10 (DRAIN) IMPLANT
DRSG OPSITE POSTOP 4X10 (GAUZE/BANDAGES/DRESSINGS) ×1 IMPLANT
ELECT REM PT RETURN 9FT ADLT (ELECTROSURGICAL) ×1
ELECTRODE REM PT RTRN 9FT ADLT (ELECTROSURGICAL) ×1 IMPLANT
EVACUATOR SILICONE 100CC (DRAIN) IMPLANT
EXTRACTOR VACUUM M CUP 4 TUBE (SUCTIONS) IMPLANT
GLOVE BIO SURGEON STRL SZ 6.5 (GLOVE) ×1 IMPLANT
GLOVE BIOGEL PI IND STRL 7.0 (GLOVE) ×2 IMPLANT
GOWN STRL REUS W/TWL LRG LVL3 (GOWN DISPOSABLE) ×2 IMPLANT
KIT ABG SYR 3ML LUER SLIP (SYRINGE) IMPLANT
NDL HYPO 25X5/8 SAFETYGLIDE (NEEDLE) IMPLANT
NEEDLE HYPO 25X5/8 SAFETYGLIDE (NEEDLE) IMPLANT
NS IRRIG 1000ML POUR BTL (IV SOLUTION) ×1 IMPLANT
PACK C SECTION WH (CUSTOM PROCEDURE TRAY) ×1 IMPLANT
PAD OB MATERNITY 4.3X12.25 (PERSONAL CARE ITEMS) ×1 IMPLANT
RTRCTR C-SECT PINK 25CM LRG (MISCELLANEOUS) IMPLANT
STRIP CLOSURE SKIN 1/2X4 (GAUZE/BANDAGES/DRESSINGS) ×1 IMPLANT
SUT CHROMIC 0 CT 1 (SUTURE) ×1 IMPLANT
SUT MNCRL AB 3-0 PS2 27 (SUTURE) ×1 IMPLANT
SUT PLAIN 2 0 (SUTURE) ×2
SUT PLAIN 2 0 XLH (SUTURE) ×1 IMPLANT
SUT PLAIN ABS 2-0 CT1 27XMFL (SUTURE) ×2 IMPLANT
SUT SILK 2 0 SH (SUTURE) IMPLANT
SUT VIC AB 0 CTX 36 (SUTURE) ×5
SUT VIC AB 0 CTX36XBRD ANBCTRL (SUTURE) ×4 IMPLANT
SUT VIC AB 2-0 SH 27 (SUTURE)
SUT VIC AB 2-0 SH 27XBRD (SUTURE) IMPLANT
TOWEL OR 17X24 6PK STRL BLUE (TOWEL DISPOSABLE) ×1 IMPLANT
TRAY FOLEY W/BAG SLVR 14FR LF (SET/KITS/TRAYS/PACK) ×1 IMPLANT
WATER STERILE IRR 1000ML POUR (IV SOLUTION) ×1 IMPLANT

## 2022-08-14 NOTE — Progress Notes (Signed)
Pt last had zofran at 1612. Scope patch placed by previous RN before transferring pt to [facility] at 1937. Pt still complaining of nausea and vomiting. I called Adonis Huguenin to inform her. She said she would place new orders. Pt made aware and I will bring them in as soon as orders are in.

## 2022-08-14 NOTE — MAU Provider Note (Cosign Needed Addendum)
Chief Complaint:  Contractions and Headache   None     HPI: Deanna May is a 32 y.o. [id number] at [redacted]w[redacted]d who presents to maternity admissions reporting onset of painful contractions today, gradually worsening since onset. Her obstetric hx  is significant for cesarean x 4.  She reports good fetal movement, denies LOF, vaginal bleeding, vaginal itching/burning, urinary symptoms, h/a, dizziness, n/v, or fever/chills.     HPI  Past Medical History: Past Medical History:  Diagnosis Date   Depression 2018    Past obstetric history: OB History  Gravida Para Term Preterm AB Living  6 4 4   1 4   SAB IAB Ectopic Multiple Live Births    1     4    # Outcome Date GA Lbr Len/2nd Weight Sex Delivery Anes PTL Lv  6 Current           5 IAB 2018 [redacted]w[redacted]d         4 Term 09/23/15 [redacted]w[redacted]d    CS-Unspec   LIV  3 Term 09/16/12     CS-Unspec   LIV  2 Term 10/30/09 [redacted]w[redacted]d    CS-Unspec   LIV  1 Term 10/16/07 [redacted]w[redacted]d    CS-Unspec   LIV    Past Surgical History: Past Surgical History:  Procedure Laterality Date   CESAREAN SECTION  2009, 2011, 2014, 2017    Family History: Family History  Problem Relation Age of Onset   Autism spectrum disorder Son    Asthma Sister    Allergies Sister    Autism spectrum disorder Son     Social History: Social History   Tobacco Use   Smoking status: Never   Smokeless tobacco: Never  Vaping Use   Vaping Use: Never used  Substance Use Topics   Alcohol use: Not Currently   Drug use: Not Currently    Types: Marijuana    Comment: last used 01/03/2022    Allergies: No Known Allergies  Meds:  Medications Prior to Admission  Medication Sig Dispense Refill Last Dose   Prenatal Vit-Fe Fumarate-FA (PRENATAL MULTIVITAMIN) TABS tablet Take 1 tablet by mouth daily at 12 noon.      terconazole (TERAZOL 3) 0.8 % vaginal cream Place 1 applicator vaginally at bedtime.       ROS:  Review of Systems  Constitutional:  Negative for chills, fatigue and fever.  Eyes:   Negative for visual disturbance.  Respiratory:  Negative for shortness of breath.   Cardiovascular:  Negative for chest pain.  Gastrointestinal:  Positive for abdominal pain. Negative for nausea and vomiting.  Genitourinary:  Negative for difficulty urinating, dysuria, flank pain, pelvic pain, vaginal bleeding, vaginal discharge and vaginal pain.  Musculoskeletal:  Positive for back pain.  Neurological:  Negative for dizziness and headaches.  Psychiatric/Behavioral: Negative.       I have reviewed patient's Past Medical Hx, Surgical Hx, Family Hx, Social Hx, medications and allergies.   Physical Exam  Patient Vitals for the past 24 hrs:  BP Temp Pulse Resp SpO2 Height Weight  08/13/22 2227 127/63 -- 84 -- 99 % -- --  08/13/22 2150 (!) 117/57 -- -- -- -- -- --  08/13/22 2148 -- 98.5 F (36.9 C) 83 17 100 % 5\' 9"  (1.753 m) 88 kg   Constitutional: Well-developed, well-nourished female in no acute distress.  Cardiovascular: normal rate Respiratory: normal effort GI: Abd soft, non-tender, gravid appropriate for gestational age.  MS: Extremities nontender, no edema, normal ROM Neurologic: Alert and oriented x  4.  GU: Neg CVAT.   Dilation: Closed Effacement (%): Thick Cervical Position: Posterior Exam by:: Elray Mcgregor, RN  FHT:  Baseline 140, moderate variability, accelerations present, no decelerations Contractions: Q 2-4 minutes, moderate to palpation   Labs: Results for orders placed or performed during the hospital encounter of 08/13/22 (from the past 24 hour(s))  Wet prep, genital     Status: None   Collection Time: 08/14/22 12:09 AM  Result Value Ref Range   Yeast Wet Prep HPF POC NONE SEEN NONE SEEN   Trich, Wet Prep NONE SEEN NONE SEEN   Clue Cells Wet Prep HPF POC NONE SEEN NONE SEEN   WBC, Wet Prep HPF POC <10 <10   Sperm NONE SEEN    A/Positive/-- (08/31 0000)  Imaging:  No results found.  MAU Course/MDM: Orders Placed This Encounter  Procedures   Wet  prep, genital    Meds ordered this encounter  Medications   lactated ringers bolus 1,000 mL   terbutaline (BRETHINE) injection 0.25 mg     NST reviewed and reactive Pt with an increase in discharge so wet prep/GC collected. Wet prep negative, GC pending Cervix unchanged in 2+ hours in MAU but pt contractions remain frequent and moderate in intensity Consult Dr Elly Modena with presentation, exam findings and test results.  Given hx C/S x 4, will add IV fluids and terbutaline in MAU to see if contractions will slow down/resolve Called Dr Landry Mellow, and if contractions do not resolve, plan to admit for observation and consider delivery by cesarean in the morning unless there are fetal or maternal indications for emergent delivery  Report to Hansel Feinstein, CNM.      Fatima Blank Certified Nurse-Midwife 08/14/2022 1:16 AM   Dilation: 1 Effacement (%): Thick Cervical Position: Posterior Station: Ballotable Exam by:: Hansel Feinstein, CNM  Continues with contractions every 3 minutes FHR pattern is reassuring Consulted Dr Landry Mellow: will admit overnight for observation and reevaluate in AM   Seabron Spates, CNM

## 2022-08-14 NOTE — Anesthesia Procedure Notes (Signed)
Combined Spinal/Epidural Patient location during procedure: OR Start time: 08/14/2022 4:16 PM End time: 08/14/2022 4:21 PM  Staffing Anesthesiologist: Audry Pili, MD Performed: anesthesiologist   Preanesthetic Checklist Completed: patient identified, IV checked, risks and benefits discussed, surgical consent, monitors and equipment checked, pre-op evaluation and timeout performed  Epidural Patient position: sitting Prep: DuraPrep Patient monitoring: continuous pulse ox and blood pressure Approach: midline Location: L2-L3 Injection technique: LOR air  Needle:  Needle type: Tuohy  Needle gauge: 17 G Needle length: 9 cm Needle insertion depth: 7 cm Catheter type: closed end flexible Catheter size: 19 Gauge Catheter at skin depth: 12 cm Epidural test dose: No test dose given due to administration of intrathecal medication.  Assessment Events: blood not aspirated, injection not painful, no injection resistance, no paresthesia and negative IV test  Additional Notes Patient identified. Risks including, but not limited to, bleeding, infection, nerve damage, paralysis, inadequate analgesia, blood pressure changes, nausea, vomiting, allergic reaction, postpartum back pain, itching, and headache were discussed. Patient expressed understanding and wished to proceed. Sterile prep and drape, including hand hygiene, mask, and sterile gloves were used. The patient was positioned and the spine was prepped. The skin was anesthetized with lidocaine. The Tuohy was advanced until LOR was achieved. A 117mm 25ga Whitacre needle was advanced through the Tuohy. Free flow of clear CSF was obtained prior to injecting local anesthetic into the CSF. The spinal needle aspirated freely following injection. The spinal needle was carefully withdrawn. The epidural catheter was then advanced into the epidural space via the Tuohy needle. The Tuohy needle was then withdrawn and the epidural catheter was taped into  place. No paraesthesia or other complications noted. The patient tolerated the procedure well.   Renold Don, MDReason for block:surgical anesthesia

## 2022-08-14 NOTE — Transfer of Care (Signed)
Immediate Anesthesia Transfer of Care Note  Patient: Deanna May  Procedure(s) Performed: CESAREAN SECTION WITH BILATERAL TUBAL LIGATION  Patient Location: PACU  Anesthesia Type:Spinal and Epidural  Level of Consciousness: awake, alert , and oriented  Airway & Oxygen Therapy: Patient Spontanous Breathing  Post-op Assessment: Report given to RN and Post -op Vital signs reviewed and stable  Post vital signs: Reviewed and stable  Last Vitals:  Vitals Value Taken Time  BP 110/63 08/14/22 1756  Temp    Pulse 75 08/14/22 1758  Resp 15 08/14/22 1758  SpO2 98 % 08/14/22 1758  Vitals shown include unvalidated device data.  Last Pain:  Vitals:   08/14/22 1312  TempSrc: Oral  PainSc:       Patients Stated Pain Goal: 4 ([id number])  Complications: No notable events documented.

## 2022-08-14 NOTE — H&P (Addendum)
OB ADMISSION/ HISTORY & PHYSICAL:  Admission Date: 08/13/2022  9:25 PM  Admit Diagnosis: Normal labor with previous uterine scar  Deanna May is a 32 y.o. female [id number] [redacted]w[redacted]d presenting for labor eval. Endorses active FM, denies LOF and vaginal bleeding. Painful ctx began @ 0300. Previous C/S x4 in [place], older 2 children with autism.   History of current pregnancy: [id number]   EDC 08/28/22 by LMP and congruent w/ 6+1 wk U/S.   Anatomy scan:  22 wks, complete w/ posterior placenta.    Significant prenatal events:  Patient Active Problem List   Diagnosis Date Noted   History of cesarean section complicating pregnancy [id number]   Pelvic pain 12/23/2018   Current moderate episode of major depressive disorder ([place]) 06/20/2018    Prenatal Labs: ABO, Rh: --/--/A POS (03/15 0105) Antibody: NEG (03/15 0105) Rubella:   immune RPR: Nonreactive (08/31 0000)  HBsAg: Negative (08/31 0000)  HIV: Non-reactive (08/31 0000)  GTT: normal 1 hr GBS:   positive GC/CHL: neg/neg Genetics: low-risk, neg horizon Vaccines: Tdap: declined Influenza: declined   OB History  Gravida Para Term Preterm AB Living  6 4 4   1 4   SAB IAB Ectopic Multiple Live Births    1     4    # Outcome Date GA Lbr Len/2nd Weight Sex Delivery Anes PTL Lv  6 Current           5 IAB 2018 [redacted]w[redacted]d         4 Term 09/23/15 [redacted]w[redacted]d    CS-Unspec   LIV  3 Term 09/16/12     CS-Unspec   LIV  2 Term 10/30/09 [redacted]w[redacted]d    CS-Unspec   LIV  1 Term 10/16/07 [redacted]w[redacted]d    CS-Unspec   LIV    Medical / Surgical History: Past medical history:  Past Medical History:  Diagnosis Date   Depression 2018    Past surgical history:  Past Surgical History:  Procedure Laterality Date   CESAREAN SECTION  2009, 2011, 2014, 2017   Family History:  Family History  Problem Relation Age of Onset   Autism spectrum disorder Son    Asthma Sister    Allergies Sister    Autism spectrum disorder Son     Social History:  reports that she has never  smoked. She has never used smokeless tobacco. She reports that she does not currently use alcohol. She reports that she does not currently use drugs after having used the following drugs: Marijuana.  Allergies: Patient has no known allergies.   Current Medications at time of admission:  Prior to Admission medications   Medication Sig Start Date End Date Taking? Authorizing Provider  Prenatal Vit-Fe Fumarate-FA (PRENATAL MULTIVITAMIN) TABS tablet Take 1 tablet by mouth daily at 12 noon.    [provider]  terconazole (TERAZOL 3) 0.8 % vaginal cream Place 1 applicator vaginally at bedtime.    [provider]    Review of Systems: Constitutional: Negative   HENT: Negative   Eyes: Negative   Respiratory: Negative   Cardiovascular: Negative   Gastrointestinal: Negative  Genitourinary: neg for bloody show, neg for LOF   Musculoskeletal: Negative   Skin: Negative   Neurological: Negative   Endo/Heme/Allergies: Negative   Psychiatric/Behavioral: Negative    Physical Exam: VS: Blood pressure (!) 111/59, pulse 85, temperature 99.4 F (37.4 C), temperature source Oral, resp. rate 16, height 5\' 9"  (1.753 m), weight 88 kg, last menstrual period 11/21/2021, SpO2 100 %. AAO x3,  no signs of distress Cardiovascular: RRR Respiratory: Unlabored GU/GI: Abdomen gravid, non-tender, non-distended, active FM Extremities: no edema, negative for pain, tenderness, and cords  Cervical exam:Dilation: 1 Effacement (%): Thick Station: -3 Exam by:: Burman Foster, CNM FHR: baseline rate 140 / variability moderate / accelerations present / absent decelerations TOCO: irreg   Prenatal Transfer Tool  Maternal Diabetes: No Genetic Screening: Normal Maternal Ultrasounds/Referrals: Normal Fetal Ultrasounds or other Referrals:  None Maternal Substance Abuse:  No Significant Maternal Medications:  None Significant Maternal Lab Results: Group B Strep positive Number of Prenatal  Visits:greater than 3 verified prenatal visits Other Comments:  None    Assessment: 32 y.o. [id number] [redacted]w[redacted]d Previous C/S x4, prodromal labor Repeat C/S Initially requested permanent sterilization, pt declines and now desires IUD placement  Latent stage of labor FHR category 1 GBS pos Pain management plan: per anesthesia   Plan:  Routine pre-op admission orders NPO (last PO was 1200 on 08/13/22) MD to determine time for surgery Dr Orlie Dakin notified of admission and plan of care during AM sign-out  Arrie Eastern DNP, CNM 08/14/2022 8:08 AM  Attestation: Patient seen and evaluated at bedside and resting comfortably in bed. States her contractions have spaced significantly to every 10-15 minutes. States they are still painful when they do occur. FHT category 1. Denies vaginal bleeding and reports good fetal movement. Plan to proceed with repeat c-section today for prodromal labor and h/o c-section x4. SVE mild change overnight from 0/th/hi to 1/th/-3. R/B/A discussed. Patient has been NPO since midnight and wishes to proceed. Patient declines tubal-ligation during procedure for contraception and plan for IUD in the postpartum period.  Dr. Bing Matter 08/14/22 2:06 PM

## 2022-08-14 NOTE — Anesthesia Preprocedure Evaluation (Addendum)
Anesthesia Evaluation  Patient identified by MRN, date of birth, ID band Patient awake    Reviewed: Allergy & Precautions, NPO status , Patient's Chart, lab work & pertinent test results  History of Anesthesia Complications Negative for: history of anesthetic complications  Airway Mallampati: II   Neck ROM: Full    Dental   Pulmonary neg pulmonary ROS   Pulmonary exam normal        Cardiovascular negative cardio ROS Normal cardiovascular exam     Neuro/Psych  PSYCHIATRIC DISORDERS  Depression    negative neurological ROS     GI/Hepatic negative GI ROS, Neg liver ROS,,,  Endo/Other  negative endocrine ROS    Renal/GU negative Renal ROS     Musculoskeletal negative musculoskeletal ROS (+)    Abdominal   Peds  Hematology  (+) Blood dyscrasia, anemia  Plt 307k    Anesthesia Other Findings   Reproductive/Obstetrics (+) Pregnancy  Repeat C/S (4 previous)                             Anesthesia Physical Anesthesia Plan  ASA: 2  Anesthesia Plan: Combined Spinal and Epidural   Post-op Pain Management: Tylenol PO (pre-op)*   Induction:   PONV Risk Score and Plan: 2 and Treatment may vary due to age or medical condition, Ondansetron and Scopolamine patch - Pre-op  Airway Management Planned: Natural Airway  Additional Equipment: None  Intra-op Plan:   Post-operative Plan:   Informed Consent: I have reviewed the patients History and Physical, chart, labs and discussed the procedure including the risks, benefits and alternatives for the proposed anesthesia with the patient or authorized representative who has indicated his/her understanding and acceptance.       Plan Discussed with: CRNA and Anesthesiologist  Anesthesia Plan Comments: (Labs reviewed, platelets acceptable. Discussed risks and benefits of spinal, including spinal/epidural hematoma, infection, failed block, and PDPH.  Patient expressed understanding and wished to proceed. )       Anesthesia Quick Evaluation

## 2022-08-14 NOTE — Op Note (Signed)
Cesarean Section Procedure Note  Indications: previous uterine incision kerr x3 or greater, latent labor  Pre-operative Diagnosis: 38 week 0 day pregnancy.  Post-operative Diagnosis: same  Surgeon: Deanna May   Assistants: Dr. Gala May and Dr. Caron May  Anesthesia: Epidural anesthesia and Spinal anesthesia  ASA May: 2   Procedure Details   CS OP NOTE Information for the patient's newborn:  Deanna, Horng Girl May [id number]  @[id number] @    Indication and Consent:  Pt has a history of cesarean section x 4 with and present in latent labor. She has received adequate prenatal care in current pregnancy by [facility]. Risks discussed include bleeding, infection, injury to surrounding organs including but not limited to bowel, bladder, and ureters. Also discussed risk of hysterectomy in case of life-threatening emergency, and death. Pt verbalizes understanding and wishes to proceed.  Procedures: The patient was taken to the operating room where anesthesia was found to be adequate and preoperative antibiotics were given for infection prophylaxis. She was prepared and draped in the dorsal supine position with a leftward tilt. A Pfannenstiel skin incision was made with scalpel. The incision was carried down to the fascia. The fascia was incised and extended laterally. The superior aspect of the fascia was grasped with Kocher clamps and the rectus muscle was dissected off. The rectus muscle was separated in the midline down to the level of pubic symphysis. Pre-peritoneal fatty tissue was extended superiorly and inferiorly to the bladder reflection with good visualization of the bladder.   The Alexis-O retractor was inserted and vesicouterine peritoneum identified. Intraabdominal survey revealed scant, clear peritoneal fluid and a thin lower uterine segment. The vesicouterine peritoneum was opened with scissors and the bladder flap was developed. A low transverse uterine incision was made with a  scalpel, the amniotic sac was ruptured and clear fluid noted. The uterine incision was extended bluntly with lateral and upward traction.   The fetus was in cephalic presentation. The head was elevated out of the pelvis with special attention paid to avoid using the uterine incision as a fulcrum. Gentle fundal pressure was applied once the head was brought into the incision. The infant was delivered with no difficulty. The mouth and nose were suctioned with a bulb. The cord was clamped and cut. The infant was handed off to the awaiting NICU staff for evaluation. The placenta was delivered intact with manual massage of uterine fundus. IV oxytocin was initiated to facilitated uterine contrations.  The inside of uterus was wiped clean. The uterine incision was closed first with 0 Vicryl in a locking pattern, second imbricating layer by 0 Monocryl  in a running pattern. Hemostasis was achieved with a figure of eight stitch using 0 Monocryl. The ovaries and tubes were found to be normal. The tubes and ovaries were then returned to the abdominal cavity. Blood clots and fluid were wiped out of the abdomen and pelvis with moist laparotomy sponges. The uterine incision was reinspected and good hemostasis was noted. Interceed applied over uterine incision.  The peritoneum was closed with 2-0 Vicryl in a continuous pattern. The muscle layer was reapproximated with 3-0 Vicryl in an interupted pattern. Fascial defect noted and repaired with 0 Vicryl suture in a continuous fashion. The fascial layer was closed with 0 Vicryl suture. The skin was closed with 4-0 monocryl subcuticularly. The patient tolerated the procedure well. All the counts were correct times three. The patient was taken to the recovery room in a stable condition. Dr. Ivin May was present for the entire procedure  and Dr. Gala May (attending) and Dr. Caron May (OB fellow) assisted.    Findings: Liveborn female infant  Estimated Blood Loss:   599 mL          Urine: 300 mL         Total IV Fluids:  1100 mL         Specimens: Placenta to L&D         Complications:  None; patient tolerated the procedure well.         Disposition: PACU - hemodynamically stable.         Condition: stable  Attending Attestation: I was present and scrubbed and the assistant was required due to complexity of anatomy. Due to the complexity of the case, Dr. Gala May (attending) and Dr. Caron May (OB fellow) was asked to serve as surgeon assist. The assistant surgeon aided in safe handling of the pelvic structures, associated retraction, and exposure of the operative field.   Delivery Summary for [place] for the patient's newborn:  Deanna, May [id number]   Delivery 08/14/2022 4:49 PM by  C-Section, Low Transverse Sex:  female Gestational Age: [redacted]w[redacted]d Delivery Clinician:   Living?:         APGARS  One minute Five minutes Ten minutes  Skin color:        Heart rate:        Grimace:        Muscle tone:        Breathing:        Totals: 8  9      Presentation/position:      Resuscitation:   Cord information:    Disposition of cord blood:     Blood gases sent?  Complications:   Placenta: Delivered:       appearance Newborn Measurements: Weight: 7 lb 13.9 oz (3570 g)  Height: 20"  Head circumference:    Chest circumference:    Other providers:    Additional  information: Forceps:   Vacuum:   Breech:   Observed anomalies     Baby Name: Deanna May    Deanna May

## 2022-08-15 ENCOUNTER — Encounter (HOSPITAL_COMMUNITY): Payer: Self-pay | Admitting: Obstetrics and Gynecology

## 2022-08-15 LAB — CBC
HCT: 30.1 % — ABNORMAL LOW (ref 36.0–46.0)
Hemoglobin: 9.8 g/dL — ABNORMAL LOW (ref 12.0–15.0)
MCH: 28.9 pg (ref 26.0–34.0)
MCHC: 32.6 g/dL (ref 30.0–36.0)
MCV: 88.8 fL (ref 80.0–100.0)
Platelets: 320 10*3/uL (ref 150–400)
RBC: 3.39 MIL/uL — ABNORMAL LOW (ref 3.87–5.11)
RDW: 12.9 % (ref 11.5–15.5)
WBC: 12.9 10*3/uL — ABNORMAL HIGH (ref 4.0–10.5)
nRBC: 0 % (ref 0.0–0.2)

## 2022-08-15 MED ORDER — POLYSACCHARIDE IRON COMPLEX 150 MG PO CAPS
150.0000 mg | ORAL_CAPSULE | Freq: Every day | ORAL | Status: DC
  Administered 2022-08-15 – 2022-08-16 (×2): 150 mg via ORAL
  Filled 2022-08-15 (×2): qty 1

## 2022-08-15 NOTE — Lactation Note (Signed)
This note was copied from a baby's chart. Lactation Consultation Note  Patient Name: Deanna May [id number] Date: 08/15/2022 Age:32 hours Reason for consult: Initial assessment;Early term 37-38.6wks  P5, 38 weeks.  Mother attempting to latch baby when [facility] entered room.  Provided pillow for support and had mother reposition to cross cradle hold for head support as she was latching baby.  Baby latched after a few attempts.  Encouraged mother to compress breast to keep baby active.  Provided hand pump with 27 flange. Feed on demand with cues.  Goal 8-12+ times per day after first 24 hrs.  Place baby STS if not cueing.  Suggest calling for help as needed.   Maternal Data Has patient been taught Hand Expression?: Yes Does the patient have breastfeeding experience prior to this delivery?: Yes How long did the patient breastfeed?: 2-3 mos.  Feeding Mother's Current Feeding Choice: Breast Milk and Formula  LATCH Score Latch: Repeated attempts needed to sustain latch, nipple held in mouth throughout feeding, stimulation needed to elicit sucking reflex.  Audible Swallowing: A few with stimulation  Type of Nipple: Everted at rest and after stimulation  Comfort (Breast/Nipple): Soft / non-tender  Hold (Positioning): Assistance needed to correctly position infant at breast and maintain latch.  LATCH Score: 7   Lactation Tools Discussed/Used Tools: Pump;Flanges Flange Size: 27 Breast pump type: Manual Pump Education: Setup, frequency, and cleaning Pumping frequency: PRN  Interventions Interventions: Breast feeding basics reviewed;Assisted with latch;Skin to skin;Hand express;Pre-pump if needed;Breast compression;Support pillows;Hand pump;Education;LC Services brochure  Discharge Pump: Personal;DEBP  Consult Status Consult Status: Follow-up Date: 08/16/22 Follow-up type: In-patient    Vivianne Master [facility] 08/15/2022, 8:18 AM

## 2022-08-15 NOTE — Progress Notes (Signed)
Subjective: POD# 1 Information for the patient's newborn:  Deanna May, Deanna May [id number]  female   Reports feeling good Feeding: breast and bottle Significant nausea during the night that has since resolved with antiemetics Reports tolerating PO and denies N/V currently, foley still in place, ambulating w/o difficulty  Pain controlled with  PO meds Denies HA/SOB/dizziness  Flatus passing Vaginal bleeding is normal, no clots     Objective:  VS:  Vitals:   08/14/22 2100 08/14/22 2200 08/14/22 2330 08/15/22 0330  BP: 116/64 103/72 102/60 104/61  Pulse: (!) 59 60 62 71  Resp: 14 16 18 16   Temp: 98.2 F (36.8 C) 98.4 F (36.9 C)    TempSrc: Oral Oral    SpO2:    100%  Weight:      Height:        Intake/Output Summary (Last 24 hours) at 08/15/2022 0625 Last data filed at 08/15/2022 0330 Gross per 24 hour  Intake 1100 ml  Output 1649 ml  Net -549 ml     Recent Labs    08/14/22 1251 08/15/22 0546  WBC 5.8 12.9*  HGB 9.3* 9.8*  HCT 28.4* 30.1*  PLT 247 320    Blood type: --/--/A POS (03/15 0105) Rubella: Immune (08/31 0000)    Physical Exam:  General: alert, cooperative, and no distress CV: Regular rate and rhythm Resp: clear Abdomen: soft, nontender, normal bowel sounds Incision: honeycomb dressing clean, dry, and intact Perineum:  Uterine Fundus: firm, below umbilicus, nontender Lochia: minimal and no clots Ext:  neg for edema, pain, tenderness, and cords   Assessment/Plan: 32 y.o.   POD# 1. [id number]                  Principal Problem:   Postpartum care following cesarean delivery Active Problems:   History of cesarean section complicating pregnancy   Previous cesarean section   Routine post-op PP care          Advance diet as tolerated Advised warm fluids and ambulation to improve GI motility Breastfeeding support Anticipate D/C 08/16/22   Arrie Eastern, DNP, CNM 08/15/2022, 6:25 AM

## 2022-08-15 NOTE — Lactation Note (Signed)
This note was copied from a baby's chart. Lactation Consultation Note  Patient Name: Deanna May Today's Date: 08/15/2022 Age:33 hours  Birth Parent wants to be seen in morning  by [facility] services, due to nausea and tiredness, infant is in [place] and Birth Parent is formula feeding infant tonight.    Maternal Data    Feeding Nipple Type: Slow - flow  LATCH Score                    Lactation Tools Discussed/Used    Interventions    Discharge    Consult Status      Eulis Canner 08/15/2022, 12:06 AM

## 2022-08-16 DIAGNOSIS — Z98891 History of uterine scar from previous surgery: Secondary | ICD-10-CM

## 2022-08-16 DIAGNOSIS — Z8659 Personal history of other mental and behavioral disorders: Secondary | ICD-10-CM

## 2022-08-16 DIAGNOSIS — D62 Acute posthemorrhagic anemia: Secondary | ICD-10-CM | POA: Diagnosis not present

## 2022-08-16 LAB — TYPE AND SCREEN
ABO/RH(D): A POS
Antibody Screen: NEGATIVE
Unit division: 0
Unit division: 0

## 2022-08-16 LAB — BPAM RBC
Blood Product Expiration Date: 202404092359
Blood Product Expiration Date: 202404092359
Unit Type and Rh: 6200
Unit Type and Rh: 6200

## 2022-08-16 MED ORDER — POLYSACCHARIDE IRON COMPLEX 150 MG PO CAPS: 150.0000 mg | ORAL_CAPSULE | Freq: Every day | ORAL | 1 refills | Status: DC

## 2022-08-16 MED ORDER — IBUPROFEN 600 MG PO TABS: 600.0000 mg | ORAL_TABLET | Freq: Four times a day (QID) | ORAL | 0 refills | Status: DC

## 2022-08-16 MED ORDER — OXYCODONE HCL 5 MG PO TABS: 5.0000 mg | ORAL_TABLET | ORAL | 0 refills | Status: DC | PRN

## 2022-08-16 MED ORDER — SENNOSIDES-DOCUSATE SODIUM 8.6-50 MG PO TABS: 2.0000 | ORAL_TABLET | Freq: Every day | ORAL | 0 refills | Status: DC

## 2022-08-16 NOTE — Discharge Summary (Signed)
RCS OB Discharge Summary       Patient Name: Deanna May DOB: [birth year] MRN: [id number]  Date of admission: 08/13/2022 Delivering MD: Bing Matter D  Date of delivery: 08/14/2022 Type of delivery: RCS  Newborn Data: Sex: Baby female Live born female  Birth Weight: 7 lb 13.9 oz (3570 g) APGAR: 61, 9  Newborn Delivery   Birth date/time: 08/14/2022 16:49:00 Delivery type: C-Section, Low Transverse Trial of labor: No C-section categorization: Repeat      Feeding: breast Infant being discharge to home with mother in stable condition.   Admitting diagnosis: History of cesarean section complicating pregnancy [id number] Previous cesarean section [Z98.891] Intrauterine pregnancy: [redacted]w[redacted]d     Secondary diagnosis:  Principal Problem:   Postpartum care following cesarean delivery Active Problems:   History of cesarean section complicating pregnancy   Previous cesarean section   Acute blood loss anemia   S/P cesarean section   H/O: depression                                Complications: None                                                              Intrapartum Procedures: cesarean: low cervical, transverse and GBS prophylaxis Postpartum Procedures: none Complications-Operative and Postpartum: none Augmentation: AROM and at time of delivery    History of Present Illness: Ms. Deanna May is a 32 y.o. female, [id number], who presents at [redacted]w[redacted]d weeks gestation. The patient has been followed at  [facility]  Her pregnancy has been complicated by:  Patient Active Problem List   Diagnosis Date Noted   Acute blood loss anemia 08/16/2022   S/P cesarean section 08/16/2022   H/O: depression 08/16/2022   History of cesarean section complicating pregnancy [id number]   Previous cesarean section 08/14/2022   Postpartum care following cesarean delivery 08/14/2022   Pelvic pain 12/23/2018   Current moderate episode of major depressive disorder  ([place]) 06/20/2018     Active Ambulatory Problems    Diagnosis Date Noted   Current moderate episode of major depressive disorder ([place]) 06/20/2018   Pelvic pain 12/23/2018   Resolved Ambulatory Problems    Diagnosis Date Noted   No Resolved Ambulatory Problems   Past Medical History:  Diagnosis Date   Depression 2018     Hospital course:  Sceduled C/S   32 y.o. yo [id number] at [redacted]w[redacted]d was admitted to the hospital 08/13/2022 for scheduled cesarean section with the following indication:Elective Repeat.Delivery details are as follows:  Membrane Rupture Time/Date: 4:48 PM ,08/14/2022   Delivery Method:C-Section, Low Transverse  Details of operation can be found in separate operative note.  Patient had a postpartum course complicated by none.  She is ambulating, tolerating a regular diet, passing flatus, and urinating well. Patient is discharged home in stable condition on  08/16/22        Newborn Data: Birth date:08/14/2022  Birth time:4:49 PM  Gender:Female  Living status:Living  Apgars:8 ,9  Weight:3570 g     Hospital Course--Scheduled Cesarean: POD#2 Patient was admitted on 3/15 in early labor and was scheduled for a scheduled RepeatX5th cesarean delivery.   She was taken to the operating room, where Dr.  Eboni performed a repeat LTCS under spinal anesthesia, with delivery of a viable baby female, with weight and Apgars as listed below. Infant was in good condition and remained at the patient's bedside. QBL was 543mls, hgb drop of 10.5-9.5-9.8 on po iron and asymptomatic. The patient was taken to recovery in good condition.  Patient planned to breast feed.  On post-op day 1, patient was doing well, tolerating a regular diet, with Hgb of 9.8.  Throughout her stay, her physical exam was WNL, her incision was CDI, and her vital signs remained stable.  By post-op day 1, she was up ad lib, tolerating a regular diet, with good pain control with po med.  She was deemed to have received the full benefit  of her hospital stay, and was discharged home in stable condition.  Contraceptive choice was undecided. Pt has h/o MDD no meds, denies si, mood stable, will have 2 week mood check if needed.       Physical exam  Vitals:   08/15/22 0330 08/15/22 1300 08/15/22 2008 08/16/22 0500  BP: 104/61 111/66 (!) 108/58 110/63  Pulse: 71 64 64 67  Resp: 16 18 17 18   Temp:  (!) 97.5 F (36.4 C) (!) 97.4 F (36.3 C) 98.2 F (36.8 C)  TempSrc:  Oral Oral Oral  SpO2: 100%  100%   Weight:      Height:       General: alert, cooperative, and no distress Lochia: appropriate Uterine Fundus: firm Incision: Healing well with no significant drainage, No significant erythema, Dressing is clean, dry, and intact, honeycomb dressing CDI Perineum: intact DVT Evaluation: No evidence of DVT seen on physical exam. Negative Homan's sign. No cords or calf tenderness. No significant calf/ankle edema.  Labs: Lab Results  Component Value Date   WBC 12.9 (H) 08/15/2022   HGB 9.8 (L) 08/15/2022   HCT 30.1 (L) 08/15/2022   MCV 88.8 08/15/2022   PLT 320 08/15/2022       No data to display          Date of discharge: 08/16/2022 Discharge Diagnoses: Term Pregnancy-delivered Discharge instruction: per After Visit Summary and "Baby and Me Booklet".  After visit meds:   Activity:           unrestricted and pelvic rest Advance as tolerated. Pelvic rest for 6 weeks.  Diet:                routine Medications: PNV, Ibuprofen, Colace, Iron, and oxy ir  Postpartum contraception: Undecided Condition:  Pt discharge to home with baby in stable condition Anemia: PO iron.   Meds: Allergies as of 08/16/2022   No Known Allergies      Medication List     STOP taking these medications    terconazole 0.8 % vaginal cream Commonly known as: TERAZOL 3       TAKE these medications    ibuprofen 600 MG tablet Commonly known as: ADVIL Take 1 tablet (600 mg total) by mouth every 6 (six) hours.   iron  polysaccharides 150 MG capsule Commonly known as: NIFEREX Take 1 capsule (150 mg total) by mouth daily.   oxyCODONE 5 MG immediate release tablet Commonly known as: Oxy IR/ROXICODONE Take 1 tablet (5 mg total) by mouth every 4 (four) hours as needed for moderate pain.   prenatal multivitamin Tabs tablet Take 1 tablet by mouth daily at 12 noon.   senna-docusate 8.6-50 MG tablet Commonly known as: Senokot-S Take 2 tablets by mouth daily.  Discharge Care Instructions  (From admission, onward)           Start     Ordered   08/16/22 0000  Discharge wound care:       Comments: Take dressing off on day 5-7 postpartum.  Report increased drainage, redness or warmth. Clean with water, let soap trickle down body. Can leave steri strips on until they fall off or take them off gently at day 10. Keep open to air, clean and dry.   08/16/22 0616            Discharge Follow Up:   Follow-up [facility]. Schedule an appointment as soon as possible for a visit in 2 week(s).   Specialty: Obstetrics and Gynecology Why: 2 week mood check and 6 week PPV Contact information: [address]  [place] [phone] [place], Dewar, PMHNP-BC  [address]  [place], [place] [address]  Cell: [phone]  Office Phone: [phone] Fax: [phone] 08/16/2022  6:16 AM

## 2022-08-16 NOTE — Anesthesia Postprocedure Evaluation (Signed)
Anesthesia Post Note  Patient: Multimedia programmer  Procedure(s) Performed: CESAREAN SECTION WITH BILATERAL TUBAL LIGATION     Anesthetic complications: no   No notable events documented.  Last Vitals:  Vitals:   08/15/22 2008 08/16/22 0500  BP: (!) 108/58 110/63  Pulse: 64 67  Resp: 17 18  Temp: (!) 36.3 C 36.8 C  SpO2: 100%     Last Pain:  Vitals:   08/16/22 0802  TempSrc:   PainSc: 0-No pain                 Audry Pili

## 2022-08-16 NOTE — Anesthesia Postprocedure Evaluation (Signed)
Anesthesia Post Note  Patient: Multimedia programmer  Procedure(s) Performed: CESAREAN SECTION WITH BILATERAL TUBAL LIGATION     Patient location during evaluation: PACU Anesthesia Type: Combined Spinal/Epidural Level of consciousness: awake and alert Pain management: pain level controlled Vital Signs Assessment: post-procedure vital signs reviewed and stable Respiratory status: spontaneous breathing and respiratory function stable Cardiovascular status: blood pressure returned to baseline and stable Postop Assessment: spinal receding and no apparent nausea or vomiting Anesthetic complications: no   No notable events documented.  Last Vitals:  Vitals:   08/15/22 2008 08/16/22 0500  BP: (!) 108/58 110/63  Pulse: 64 67  Resp: 17 18  Temp: (!) 36.3 C 36.8 C  SpO2: 100%     Last Pain:  Vitals:   08/16/22 0802  TempSrc:   PainSc: 0-No pain                 Audry Pili

## 2022-08-16 NOTE — Lactation Note (Signed)
This note was copied from a baby's chart. Lactation Consultation Note  Patient Name: Girl Daileen Rines [id number] Date: 08/16/2022 Age:32 hours Reason for consult: Follow-up assessment  P5, Reassured mother about her milk supply.  Encouraged breastfeeding before formula to help establish mother's milk supply. Feed on demand with cues.  Goal 8-12+ times per day after first 24 hrs.  Place baby STS if not cueing.  Reviewed engorgement care and monitoring voids/stools.  Maternal Data Has patient been taught Hand Expression?: Yes  Feeding Mother's Current Feeding Choice: Breast Milk and Formula Interventions Interventions: Breast feeding basics reviewed;Education  Discharge Discharge Education: Engorgement and breast care;Warning signs for feeding baby  Consult Status Consult Status: Complete Date: 08/16/22    Vivianne Master [facility] 08/16/2022, 10:20 AM

## 2022-08-16 NOTE — Progress Notes (Signed)
CSW received consult for hx of Depression. CSW met with MOB to offer support and complete assessment. When CSW entered room, MOB was observed sitting in hospital bed, infant was asleep on back in bassinet. CSW introduced self and explained reason for consult. MOB presented as calm, was agreeable to consult and remained engaged throughout encounter.   CSW inquired how MOB has felt emotionally since delivery and during pregnancy. MOB shares that she is doing okay, sharing that she did endorse some symptoms of depression during pregnancy, marked by feelings of sadness, and "not wanting to do much." MOB shares that she felt "all over the place" during pregnancy due to losing her housing October 2023. MOB states that she moved in with her mother whom she has not lived with since she was a teenager. MOB shares that she feels safe in her living arrangement but is ready to be back in her own space. MOB shares that 2 of her children currently reside in [place] with their father.   CSW inquired about MOB's mental health history. MOB states she has endorsed symptoms of depression "most of (her) life" and was diagnosed in 2020. MOB states she was prescribed a psychotropic medication in 2020 (MOB was unable to recall the name of the medication), but states that she did not take the medicine. MOB states she is not current with a therapist but was agreeable to outpatient mental health resources, which CSW provided. MOB inquired about supports. MOB states her mother works a lot but identified FOB and her older siblings as additional supports while she heals postpartum. MOB states she endorsed symptoms of postpartum depression after the birth of her 2nd son who is now 77 years old. MOB recalls feeling alone. MOB shares that two of her children have diagnoses of autism, including her oldest son who was undiagnosed at the time. MOB recalls her oldest son not sleeping well around the time of her 2nd son's arrival which made her  postpartum period challenging. MOB denied current SI/HI/DV.   CSW provided education regarding the baby blues period vs. perinatal mood disorders, discussed treatment and gave resources for mental health follow up if concerns arise.  CSW recommends self-evaluation during the postpartum time period using the New Mom Checklist from Postpartum Progress and encouraged MOB to contact a medical professional if symptoms are noted at any time.    MOB reports she has all needed items for infant, including a car seat and bassinet. MOB has chosen [facility] for infant's follow up care. CSW inquired about resource needs. MOB states that she is connected with a peer support specialist (was not able to recall name of agency) who has helped her with resource needs during her pregnancy with infant. MOB expressed interest in housing resources. CSW provided information for [employer] and [employer] and [employer]. MOB also expressed interest in a [facility] referral, which CSW placed with MOB's verbal consent. MOB states she receives food stamps.  MOB declined review of Sudden Infant Death Syndrome (SIDS) precautions, sharing she is aware of safe sleep practices. MOB denied additional resource needs at this time.   CSW identifies no further need for intervention and no barriers to discharge at this time.  Signed,  Berniece Salines, MSW, LCSWA, LCASA 08/16/2022 1:33 PM

## 2022-08-19 ENCOUNTER — Encounter (HOSPITAL_COMMUNITY)
Admission: RE | Admit: 2022-08-19 | Discharge: 2022-08-19 | Disposition: A | Discharge status: Home / Self Care | Payer: Medicaid Other | Source: Ambulatory Visit | Attending: Obstetrics and Gynecology | Admitting: Obstetrics and Gynecology

## 2022-08-21 ENCOUNTER — Inpatient Hospital Stay (HOSPITAL_COMMUNITY)
Admission: AD | Admit: 2022-08-21 | Payer: Medicaid Other | Source: Home / Self Care | Admitting: Obstetrics and Gynecology

## 2022-08-26 ENCOUNTER — Telehealth (HOSPITAL_COMMUNITY): Payer: Self-pay | Admitting: *Deleted

## 2022-08-26 NOTE — Telephone Encounter (Signed)
Left phone voicemail message.  Odis Hollingshead, RN 08-26-2022 at 1:46p

## 2022-09-30 LAB — PAP IG AND HPV HIGH-RISK
HPV 16/18: NEGATIVE
HPV Aptima: POSITIVE

## 2023-01-14 ENCOUNTER — Encounter (HOSPITAL_COMMUNITY): Payer: Self-pay | Admitting: *Deleted

## 2023-01-14 ENCOUNTER — Ambulatory Visit (HOSPITAL_COMMUNITY)
Admission: EM | Admit: 2023-01-14 | Discharge: 2023-01-14 | Disposition: A | Discharge status: Home / Self Care | Payer: Medicaid Other | Attending: Nurse Practitioner | Admitting: Nurse Practitioner

## 2023-01-14 DIAGNOSIS — Z3201 Encounter for pregnancy test, result positive: Secondary | ICD-10-CM | POA: Diagnosis present

## 2023-01-14 DIAGNOSIS — L259 Unspecified contact dermatitis, unspecified cause: Secondary | ICD-10-CM | POA: Insufficient documentation

## 2023-01-14 DIAGNOSIS — N898 Other specified noninflammatory disorders of vagina: Secondary | ICD-10-CM | POA: Diagnosis present

## 2023-01-14 LAB — POCT URINE PREGNANCY: Preg Test, Ur: POSITIVE — AB

## 2023-01-14 LAB — HIV ANTIBODY (ROUTINE TESTING W REFLEX): HIV Screen 4th Generation wRfx: NONREACTIVE

## 2023-01-14 MED ORDER — HYDROCORTISONE 1 % EX CREA: TOPICAL_CREAM | CUTANEOUS | 0 refills | Status: DC

## 2023-01-14 NOTE — ED Provider Notes (Signed)
[facility]    CSN: [id number] Arrival date & time: 01/14/23  1205      History   Chief Complaint Chief Complaint  Patient presents with   Rash   Headache   Dizziness   Abdominal Pain   Vaginal Itching   Vaginal Discharge    HPI Deanna May is a 32 y.o. female.   Patient presents today for rash on the left side of her buttock for the past week.  Reports it is raised and itchy.  No burning, redness, oozing, scaling, blisters, or pain.  No fevers, shortness of breath, throat or tongue swelling, or new muscle pain/joint aches.  No recent change in any soaps, detergents, or personal care products.  Has not take anything for symptoms so far.  Patient also concerned about lower abdominal cramping, and vaginal discharge for the past week.  Reports the vaginal discharge is thick and white.  There is no odor to it.  Reports she is very itchy in her perineum.  Denies rashes, sores, or lesions in her perineum.  Reports she is currently sexually active, however no known exposures to STI.  Last menstrual period was 12/11/2022, when patient asked if she could be pregnant she stated "I most likely am."  Reports last pregnancy, she had frequent yeast and BV infections.  No breast tenderness or vaginal discharge.    Past Medical History:  Diagnosis Date   Depression 2018    Patient Active Problem List   Diagnosis Date Noted   Acute blood loss anemia 08/16/2022   S/P cesarean section 08/16/2022   H/O: depression 08/16/2022   History of cesarean section complicating pregnancy 08/14/2022   Previous cesarean section 08/14/2022   Postpartum care following cesarean delivery 08/14/2022   Pelvic pain 12/23/2018   Current moderate episode of major depressive disorder ([facility]) 06/20/2018    Past Surgical History:  Procedure Laterality Date   CESAREAN SECTION  2009, 2011, 2014, 2017   CESAREAN SECTION WITH BILATERAL TUBAL LIGATION N/A 08/14/2022   Procedure: CESAREAN SECTION WITH  BILATERAL TUBAL LIGATION;  Surgeon: Jackie Plum, MD;  Location: [facility];  Service: Obstetrics;  Laterality: N/A;    OB History     Gravida  6   Para  5   Term  5   Preterm      AB  1   Living  5      SAB      IAB  1   Ectopic      Multiple  0   Live Births  5            Home Medications    Prior to Admission medications   Medication Sig Start Date End Date Taking? Authorizing Provider  hydrocortisone cream 1 % Apply to affected area 2 times daily 01/14/23  Yes Cathlean Marseilles A, NP  iron polysaccharides (NIFEREX) 150 MG capsule Take 1 capsule (150 mg total) by mouth daily. 08/16/22   Dale Rogers, FNP  Prenatal Vit-Fe Fumarate-FA (PRENATAL MULTIVITAMIN) TABS tablet Take 1 tablet by mouth daily at 12 noon.    [provider]  senna-docusate (SENOKOT-S) 8.6-50 MG tablet Take 2 tablets by mouth daily. 08/16/22   Dale , FNP    Family History Family History  Problem Relation Age of Onset   Autism spectrum disorder Son    Asthma Sister    Allergies Sister    Autism spectrum disorder Son     Social History Social History   Tobacco  Use   Smoking status: Never   Smokeless tobacco: Never  Vaping Use   Vaping status: Never Used  Substance Use Topics   Alcohol use: Yes   Drug use: Yes    Types: Marijuana     Allergies   Patient has no known allergies.   Review of Systems Review of Systems Per HPI  Physical Exam Triage Vital Signs ED Triage Vitals  Encounter Vitals Group     BP 01/14/23 1410 117/74     Systolic BP Percentile --      Diastolic BP Percentile --      Pulse Rate 01/14/23 1410 88     Resp 01/14/23 1410 18     Temp 01/14/23 1410 98.1 F (36.7 C)     Temp Source 01/14/23 1410 Oral     SpO2 01/14/23 1410 100 %     Weight --      Height --      Head Circumference --      Peak Flow --      Pain Score 01/14/23 1408 8     Pain Loc --      Pain Education --      Exclude from Growth Chart --    No data  found.  Updated Vital Signs BP 117/74 (BP Location: Left Arm)   Pulse 88   Temp 98.1 F (36.7 C) (Oral)   Resp 18   LMP 12/11/2022 (Exact Date)   SpO2 100%   Visual Acuity Right Eye Distance:   Left Eye Distance:   Bilateral Distance:    Right Eye Near:   Left Eye Near:    Bilateral Near:     Physical Exam Vitals and nursing note reviewed. Exam conducted with a chaperone present Oneal Deputy, CMA).  Constitutional:      General: She is not in acute distress.    Appearance: Normal appearance. She is not toxic-appearing.  Pulmonary:     Effort: Pulmonary effort is normal. No respiratory distress.  Genitourinary:    Pubic Area: No rash.      Labia:        Right: No rash, tenderness, lesion or injury.        Left: No rash, tenderness, lesion or injury.   Lymphadenopathy:     Lower Body: No right inguinal adenopathy. No left inguinal adenopathy.  Skin:    General: Skin is warm and dry.     Coloration: Skin is not jaundiced or pale.     Findings: Rash present. No erythema.     Comments: Scant papules noted to left buttock diffusely; no surrounding erythema, warmth, active drainage  Neurological:     Mental Status: She is alert and oriented to person, place, and time.     Motor: No weakness.     Gait: Gait normal.  Psychiatric:        Mood and Affect: Mood normal.        Behavior: Behavior is cooperative.      UC Treatments / Results  Labs (all labs ordered are listed, but only abnormal results are displayed) Labs Reviewed  POCT URINE PREGNANCY - Abnormal; Notable for the following components:      Result Value   Preg Test, Ur Positive (*)    All other components within normal limits  HIV ANTIBODY (ROUTINE TESTING W REFLEX)  RPR  CERVICOVAGINAL ANCILLARY ONLY    EKG   Radiology No results found.  Procedures Procedures (including critical care time)  Medications Ordered  in UC Medications - No data to display  Initial Impression / Assessment and  Plan / UC Course  I have reviewed the triage vital signs and the nursing notes.  Pertinent labs & imaging results that were available during my care of the patient were reviewed by me and considered in my medical decision making (see chart for details).   Patient is well-appearing, normotensive, afebrile, not tachycardic, not tachypneic, oxygenating well on room air.    1. Contact dermatitis, unspecified contact dermatitis type, unspecified trigger Treat with topical hydrocortisone cream Recommended avoidance of triggers  2. Positive urine pregnancy test Recommended establishing care with OB/GYN EDD 09/17/2023  3. Vaginal discharge Cytology self swab is pending Would likely need to treat with topical treatments as patient is currently pregnant  The patient was given the opportunity to ask questions.  All questions answered to their satisfaction.  The patient is in agreement to this plan.    Final Clinical Impressions(s) / UC Diagnoses   Final diagnoses:  Contact dermatitis, unspecified contact dermatitis type, unspecified trigger  Positive urine pregnancy test  Vaginal discharge     Discharge Instructions      The urine pregnancy test today is positive.  Your estimate due date is September 17, 2023.  You are 4 weeks, 6 days pregnant based on your last period.  Please schedule a follow up with your OB/GYN to discuss new pregnancy.  We are testing the vaginal swab and blood work today and will contact you if anything comes back positive.    For the rash, you can use hydrocortisone cream I have sent to the pharmacy.     ED Prescriptions     Medication Sig Dispense Auth. Provider   hydrocortisone cream 1 % Apply to affected area 2 times daily 15 g Valentino Nose, NP      PDMP not reviewed this encounter.   Valentino Nose, NP 01/14/23 [id number]

## 2023-01-14 NOTE — ED Triage Notes (Signed)
Pt states for the past week she has had headache, dizziness with standing, lower abdominal cramping and pain.   She complains of vaginal itching, discharge x 2 days.   She also has a rash on her left buttocks.    She hasn't taken any meds for used anything OTC for the sx.

## 2023-01-14 NOTE — Discharge Instructions (Addendum)
The urine pregnancy test today is positive.  Your estimate due date is September 17, 2023.  You are 4 weeks, 6 days pregnant based on your last period.  Please schedule a follow up with your OB/GYN to discuss new pregnancy.  We are testing the vaginal swab and blood work today and will contact you if anything comes back positive.    For the rash, you can use hydrocortisone cream I have sent to the pharmacy.

## 2023-01-15 LAB — RPR: RPR Ser Ql: NONREACTIVE

## 2023-01-18 ENCOUNTER — Telehealth: Payer: Self-pay | Admitting: Emergency Medicine

## 2023-01-18 LAB — CERVICOVAGINAL ANCILLARY ONLY
Bacterial Vaginitis (gardnerella): POSITIVE — AB
Candida Glabrata: NEGATIVE
Candida Vaginitis: NEGATIVE
Chlamydia: NEGATIVE
Comment: NEGATIVE
Comment: NEGATIVE
Comment: NEGATIVE
Comment: NEGATIVE
Comment: NEGATIVE
Comment: NORMAL
Neisseria Gonorrhea: NEGATIVE
Trichomonas: NEGATIVE

## 2023-01-18 MED ORDER — METRONIDAZOLE 0.75 % VA GEL: 1.0000 | Freq: Every day | VAGINAL | 0 refills | Status: AC

## 2023-01-20 ENCOUNTER — Inpatient Hospital Stay (HOSPITAL_COMMUNITY): Payer: Medicaid Other

## 2023-01-20 ENCOUNTER — Encounter (HOSPITAL_COMMUNITY): Payer: Self-pay | Admitting: *Deleted

## 2023-01-20 ENCOUNTER — Inpatient Hospital Stay (HOSPITAL_COMMUNITY)
Admission: AD | Admit: 2023-01-20 | Discharge: 2023-01-20 | Disposition: A | Discharge status: Home / Self Care | Payer: Medicaid Other | Attending: Obstetrics and Gynecology | Admitting: Obstetrics and Gynecology

## 2023-01-20 DIAGNOSIS — B9689 Other specified bacterial agents as the cause of diseases classified elsewhere: Secondary | ICD-10-CM | POA: Diagnosis not present

## 2023-01-20 DIAGNOSIS — O23591 Infection of other part of genital tract in pregnancy, first trimester: Secondary | ICD-10-CM | POA: Insufficient documentation

## 2023-01-20 DIAGNOSIS — O3680X0 Pregnancy with inconclusive fetal viability, not applicable or unspecified: Secondary | ICD-10-CM | POA: Diagnosis not present

## 2023-01-20 DIAGNOSIS — Z3A01 Less than 8 weeks gestation of pregnancy: Secondary | ICD-10-CM | POA: Diagnosis not present

## 2023-01-20 DIAGNOSIS — R102 Pelvic and perineal pain: Secondary | ICD-10-CM | POA: Diagnosis not present

## 2023-01-20 DIAGNOSIS — O26851 Spotting complicating pregnancy, first trimester: Secondary | ICD-10-CM | POA: Insufficient documentation

## 2023-01-20 DIAGNOSIS — O209 Hemorrhage in early pregnancy, unspecified: Secondary | ICD-10-CM

## 2023-01-20 DIAGNOSIS — O26891 Other specified pregnancy related conditions, first trimester: Secondary | ICD-10-CM | POA: Insufficient documentation

## 2023-01-20 LAB — URINALYSIS, ROUTINE W REFLEX MICROSCOPIC
Bacteria, UA: NONE SEEN
Bilirubin Urine: NEGATIVE
Glucose, UA: NEGATIVE mg/dL
Ketones, ur: 5 mg/dL — AB
Leukocytes,Ua: NEGATIVE
Nitrite: NEGATIVE
Protein, ur: NEGATIVE mg/dL
Specific Gravity, Urine: 1.031 — ABNORMAL HIGH (ref 1.005–1.030)
pH: 5 (ref 5.0–8.0)

## 2023-01-20 LAB — CBC
HCT: 36.7 % (ref 36.0–46.0)
Hemoglobin: 12.2 g/dL (ref 12.0–15.0)
MCH: 28.5 pg (ref 26.0–34.0)
MCHC: 33.2 g/dL (ref 30.0–36.0)
MCV: 85.7 fL (ref 80.0–100.0)
Platelets: 422 10*3/uL — ABNORMAL HIGH (ref 150–400)
RBC: 4.28 MIL/uL (ref 3.87–5.11)
RDW: 13.9 % (ref 11.5–15.5)
WBC: 6.6 10*3/uL (ref 4.0–10.5)
nRBC: 0 % (ref 0.0–0.2)

## 2023-01-20 LAB — WET PREP, GENITAL
Sperm: NONE SEEN
Trich, Wet Prep: NONE SEEN
WBC, Wet Prep HPF POC: <10 (ref <10)
Yeast Wet Prep HPF POC: NONE SEEN

## 2023-01-20 LAB — HCG, QUANTITATIVE, PREGNANCY: hCG, Beta Chain, Quant, S: 19244 m[IU]/mL — ABNORMAL HIGH (ref <5)

## 2023-01-20 NOTE — MAU Note (Signed)
.  Deanna May is a 32 y.o. at [redacted]w[redacted]d here in MAU reporting: started spotting yesterday bleeding a little bit more today (light). Has been having cramping x 1 week.  LMP: 12/11/22 Onset of complaint: yesterday Pain score: 6 Vitals:   01/20/23 1220  BP: (!) 122/52  Pulse: 86  Resp: 18  Temp: 98.5 F (36.9 C)     FHT:n/a Lab orders placed from triage:  u/a, wet prep, GC,

## 2023-01-20 NOTE — MAU Provider Note (Signed)
History     [id number]  Arrival date and time: 01/20/23 1143    Chief Complaint  Patient presents with   Vaginal Bleeding   Abdominal Pain     HPI Deanna May is a 32 y.o. at [redacted]w[redacted]d with PMHx notable for five prior cesarean, who presents for vaginal bleeding and cramping.   Patient last delivered by cesarean in 07/2022 Reports she has been cramping for a few days and then started bleeding yesterday, less than a period Some vaginal discharge but has known BV infection, has metrogel at home No burning or pain with urination   --/--/A POS Performed at [facility], [address], [place], [place] [address]  [phone])  OB History     Gravida  7   Para  5   Term  5   Preterm      AB  1   Living  5      SAB      IAB  1   Ectopic      Multiple  0   Live Births  5           Past Medical History:  Diagnosis Date   Depression 2018    Past Surgical History:  Procedure Laterality Date   CESAREAN SECTION  2009, 2011, 2014, 2017   CESAREAN SECTION WITH BILATERAL TUBAL LIGATION N/A 08/14/2022   Procedure: CESAREAN SECTION WITH BILATERAL TUBAL LIGATION;  Surgeon: Jackie Plum, MD;  Location: [facility] LD ORS;  Service: Obstetrics;  Laterality: N/A;    Family History  Problem Relation Age of Onset   Autism spectrum disorder Son    Asthma Sister    Allergies Sister    Autism spectrum disorder Son     Social History   Socioeconomic History   Marital status: Single    Spouse name: Karie Mainland   Number of children: 4   Years of education: 12   Highest education level: High school graduate  Occupational History   Occupation: Homemaker  Tobacco Use   Smoking status: Never   Smokeless tobacco: Never  Vaping Use   Vaping status: Never Used  Substance and Sexual Activity   Alcohol use: Yes   Drug use: Yes    Types: Marijuana   Sexual activity: Yes    Birth control/protection: None  Other Topics Concern   Not on file  Social History  Narrative   Not on file   Social Determinants of Health   Financial Resource Strain: High Risk (12/23/2018)   Overall Financial Resource Strain (CARDIA)    Difficulty of Paying Living Expenses: Hard  Food Insecurity: No Food Insecurity (08/14/2022)   Hunger Vital Sign    Worried About Running Out of Food in the Last Year: Never true    Ran Out of Food in the Last Year: Never true  Transportation Needs: No Transportation Needs (08/14/2022)   PRAPARE - Administrator, Civil Service (Medical): No    Lack of Transportation (Non-Medical): No  Physical Activity: Inactive (06/10/2018)   Exercise Vital Sign    Days of Exercise per Week: 0 days    Minutes of Exercise per Session: 0 min  Stress: Stress Concern Present (06/10/2018)   [employer] of Occupational Health - Occupational Stress Questionnaire    Feeling of Stress : Rather much  Social Connections: Somewhat Isolated (06/10/2018)   Social Connection and Isolation Panel [NHANES]    Frequency of Communication with Friends and Family: More than three times  a week    Frequency of Social Gatherings with Friends and Family: Never    Attends Religious Services: Never    Database administrator or Organizations: No    Attends Banker Meetings: Never    Marital Status: Living with partner  Intimate Partner Violence: Not At Risk (08/14/2022)   Humiliation, Afraid, Rape, and Kick questionnaire    Fear of Current or Ex-Partner: No    Emotionally Abused: No    Physically Abused: No    Sexually Abused: No    No Known Allergies  No current facility-administered medications on file prior to encounter.   Current Outpatient Medications on File Prior to Encounter  Medication Sig Dispense Refill   hydrocortisone cream 1 % Apply to affected area 2 times daily 15 g 0   iron polysaccharides (NIFEREX) 150 MG capsule Take 1 capsule (150 mg total) by mouth daily. 30 capsule 1   metroNIDAZOLE (METROGEL) 0.75 % vaginal gel  Place 1 Applicatorful vaginally at bedtime for 5 days. 50 g 0   Prenatal Vit-Fe Fumarate-FA (PRENATAL MULTIVITAMIN) TABS tablet Take 1 tablet by mouth daily at 12 noon.     senna-docusate (SENOKOT-S) 8.6-50 MG tablet Take 2 tablets by mouth daily. 30 tablet 0     ROS Pertinent positives and negative per HPI, all others reviewed and negative  Physical Exam   BP (!) 122/52   Pulse 86   Temp 98.5 F (36.9 C)   Resp 18   Ht 5\' 9"  (1.753 m)   Wt 73.5 kg   LMP 12/11/2022 (Exact Date)   BMI 23.92 kg/m   Patient Vitals for the past 24 hrs:  BP Temp Pulse Resp Height Weight  01/20/23 1220 (!) 122/52 98.5 F (36.9 C) 86 18 5\' 9"  (1.753 m) 73.5 kg    Physical Exam Vitals reviewed.  Constitutional:      General: She is not in acute distress.    Appearance: She is well-developed. She is not diaphoretic.  Eyes:     General: No scleral icterus. Pulmonary:     Effort: Pulmonary effort is normal. No respiratory distress.  Abdominal:     General: There is no distension.     Palpations: Abdomen is soft.     Tenderness: There is no abdominal tenderness. There is no guarding or rebound.  Skin:    General: Skin is warm and dry.  Neurological:     Mental Status: She is alert.     Coordination: Coordination normal.      Cervical Exam    Bedside Ultrasound Pt informed that the ultrasound is considered a limited OB ultrasound and is not intended to be a complete ultrasound exam.  Patient also informed that the ultrasound is not being completed with the intent of assessing for fetal or placental anomalies or any pelvic abnormalities.  Explained that the purpose of today's ultrasound is to assess for  viability.  Patient acknowledges the purpose of the exam and the limitations of the study.      My interpretation: First trimester findings: Intrauterine gestational sac seen: yes, no clear structures  Labs Results for orders placed or performed during the hospital encounter of 01/20/23  (from the past 24 hour(s))  Urinalysis, Routine w reflex microscopic -Urine, Clean Catch     Status: Abnormal   Collection Time: 01/20/23 12:23 PM  Result Value Ref Range   Color, Urine YELLOW YELLOW   APPearance HAZY (A) CLEAR   Specific Gravity, Urine 1.031 (H) 1.005 -  1.030   pH 5.0 5.0 - 8.0   Glucose, UA NEGATIVE NEGATIVE mg/dL   Hgb urine dipstick SMALL (A) NEGATIVE   Bilirubin Urine NEGATIVE NEGATIVE   Ketones, ur 5 (A) NEGATIVE mg/dL   Protein, ur NEGATIVE NEGATIVE mg/dL   Nitrite NEGATIVE NEGATIVE   Leukocytes,Ua NEGATIVE NEGATIVE   RBC / HPF 0-5 0 - 5 RBC/hpf   WBC, UA 0-5 0 - 5 WBC/hpf   Bacteria, UA NONE SEEN NONE SEEN   Squamous Epithelial / HPF 0-5 0 - 5 /HPF   Mucus PRESENT   Wet prep, genital     Status: Abnormal   Collection Time: 01/20/23 12:23 PM   Specimen: PATH Cytology Cervicovaginal Ancillary Only  Result Value Ref Range   Yeast Wet Prep HPF POC NONE SEEN NONE SEEN   Trich, Wet Prep NONE SEEN NONE SEEN   Clue Cells Wet Prep HPF POC PRESENT (A) NONE SEEN   WBC, Wet Prep HPF POC <10 <10   Sperm NONE SEEN   ABO/Rh     Status: None   Collection Time: 01/20/23  1:18 PM  Result Value Ref Range   ABO/RH(D)      A POS Performed at [facility], [address], [place], [place] [address]   CBC     Status: Abnormal   Collection Time: 01/20/23  1:20 PM  Result Value Ref Range   WBC 6.6 4.0 - 10.5 K/uL   RBC 4.28 3.87 - 5.11 MIL/uL   Hemoglobin 12.2 12.0 - 15.0 g/dL   HCT 66.4 40.3 - 47.4 %   MCV 85.7 80.0 - 100.0 fL   MCH 28.5 26.0 - 34.0 pg   MCHC 33.2 30.0 - 36.0 g/dL   RDW 25.9 56.3 - 87.5 %   Platelets 422 (H) 150 - 400 K/uL   nRBC 0.0 0.0 - 0.2 %    Imaging No results found.  MAU Course  Procedures Lab Orders         Wet prep, genital         Urinalysis, Routine w reflex microscopic -Urine, Clean Catch         CBC         hCG, quantitative, pregnancy    No orders of the defined types were placed in this encounter.  Imaging Orders          US OB LESS THAN 14 WEEKS WITH OB TRANSVAGINAL         US OB LESS THAN 14 WEEKS WITH OB TRANSVAGINAL     MDM Moderate (Level 3-4)  Assessment and Plan  #Vaginal bleeding in pregnancy, first trimester #pelvic plain in pregnancy, first trimester #[redacted] weeks gestation of pregnancy US shows IUP, only yolk sac present. We discussed that vaginal bleeding in the first trimester is common, and that 80-90% of patients will go on to have a normal pregnancy with a live delivery. The remainder are at increased risk for miscarriage, unfortunately there are no known interventions to mitigate this risk. --/--/A POS Performed at [facility], [address], [place], [place] [address]  (08/21 1318), rhogam not indicated. We discussed return precautions including crescendo abdominal pain, heavy vaginal bleeding soaking >1 pad/hour, and fever.  #BV Has metrogel at home   Dispo: discharged to home in stable condition   Venora Maples, MD/MPH 01/20/23 2:16 PM  Allergies as of 01/20/2023   No Known Allergies      Medication List     TAKE  these medications    hydrocortisone cream 1 % Apply to affected area 2 times daily   iron polysaccharides 150 MG capsule Commonly known as: NIFEREX Take 1 capsule (150 mg total) by mouth daily.   metroNIDAZOLE 0.75 % vaginal gel Commonly known as: METROGEL Place 1 Applicatorful vaginally at bedtime for 5 days.   prenatal multivitamin Tabs tablet Take 1 tablet by mouth daily at 12 noon.   senna-docusate 8.6-50 MG tablet Commonly known as: Senokot-S Take 2 tablets by mouth daily.

## 2023-01-20 NOTE — Discharge Instructions (Signed)
Your ultrasound shows a pregnancy inside your uterus. We discussed that vaginal bleeding in the first trimester is common, and that 80-90% of patients will go on to have a normal pregnancy with a live delivery. The remainder are at increased risk for miscarriage, unfortunately there are no known interventions to mitigate this risk. We discussed return precautions including crescendo abdominal pain, heavy vaginal bleeding soaking >1 pad/hour, and fever.  I also recommend a follow up scan in about two weeks to make sure the pregnancy is progressing. I have placed an order and you should hear from the clinic soon.

## 2023-01-21 LAB — GC/CHLAMYDIA PROBE AMP (~~LOC~~) NOT AT ARMC
Chlamydia: NEGATIVE
Comment: NEGATIVE
Comment: NORMAL
Neisseria Gonorrhea: NEGATIVE

## 2023-01-21 LAB — ABO/RH: ABO/RH(D): A POS

## 2023-02-03 ENCOUNTER — Other Ambulatory Visit: Payer: Self-pay

## 2023-02-03 ENCOUNTER — Ambulatory Visit (INDEPENDENT_AMBULATORY_CARE_PROVIDER_SITE_OTHER): Payer: Medicaid Other

## 2023-02-03 DIAGNOSIS — Z3A01 Less than 8 weeks gestation of pregnancy: Secondary | ICD-10-CM | POA: Diagnosis not present

## 2023-02-03 DIAGNOSIS — Z3481 Encounter for supervision of other normal pregnancy, first trimester: Secondary | ICD-10-CM

## 2023-02-03 DIAGNOSIS — O3680X0 Pregnancy with inconclusive fetal viability, not applicable or unspecified: Secondary | ICD-10-CM

## 2023-02-25 ENCOUNTER — Ambulatory Visit: Payer: Medicaid Other | Admitting: *Deleted

## 2023-02-25 ENCOUNTER — Other Ambulatory Visit (HOSPITAL_COMMUNITY)
Admission: RE | Admit: 2023-02-25 | Discharge: 2023-02-25 | Disposition: A | Discharge status: Home / Self Care | Payer: Medicaid Other | Source: Ambulatory Visit | Attending: Obstetrics & Gynecology | Admitting: Obstetrics & Gynecology

## 2023-02-25 VITALS — BP 108/65 | HR 80 | Wt 166.4 lb

## 2023-02-25 DIAGNOSIS — N898 Other specified noninflammatory disorders of vagina: Secondary | ICD-10-CM | POA: Diagnosis present

## 2023-02-25 DIAGNOSIS — O0990 Supervision of high risk pregnancy, unspecified, unspecified trimester: Secondary | ICD-10-CM | POA: Diagnosis present

## 2023-02-25 MED ORDER — PRENATAL VITAMIN PLUS LOW IRON 27-1 MG PO TABS: 1.0000 | ORAL_TABLET | Freq: Every day | ORAL | 12 refills | Status: AC

## 2023-02-25 NOTE — Patient Instructions (Signed)
[facility] [employer] has a partnership with the [employer] to provide prenatal navigation for the most needed resources in our community. In order to see how we can help connect you to these resources we need consent to contact you. Please complete the very short consent using the link below:   English Link: https://[place].tfaforms.net/283?site=16  Spanish Link: https://[place].tfaforms.net/287?site=16   Options for Doula Care in the [place] Area  As you review your birthing options, consider having a birth doula. A doula is trained to provide support before, during and just after you give birth. There are also postpartum doulas that help you adjust to new parenthood.  While doulas do not provide medical care, they do provide emotional, physical and educational support. A few months before your baby arrives, doulas can help answer questions, ease concerns and help you create and support your birthing plan.    Doulas can help reduce your stress and comfort you and your partner. They can help you cope with labor by helping you use breathing techniques, massage, creative labor positioning, essential oils and affirmations.   Studies show that the benefits of having a doula include:   A more positive birth experience  Fewer requests for pain-relief medication  Less likelihood of cesarean section, commonly called a c-section   Doulas are typically hired via a [employer] account planner between you and the doula. We are happy to provide a list of the most active doulas in the area, all of whom are credentialed by [facility] and will not count as a visitor at your birth.  There are several options for no-cost doula care at our hospital, including:  [facility] Volunteer Doula Program Every [employer] Program A Cure 4 Moms Doula Study (available only at [employer] for [facility], [place], [place] and [facility] offices)  For more information on these programs or to receive  a list of doulas active in our area, please email [email or web address]

## 2023-02-25 NOTE — Progress Notes (Signed)
Dating and viability scan done on 02/03/23. Informal BS [place] today. Fetal heart motion observed. FHR 148. Fetal movement observed. Fetal appearance and informal CRL align with expected GA age based on previous US.

## 2023-02-25 NOTE — Progress Notes (Signed)
New OB Intake  I connected with Deanna May  on 02/25/23 at  8:15 AM EDT by In Person Visit and verified that I am speaking with the correct person using two identifiers. Nurse is located at [facility] and pt is located at [facility].  I discussed the limitations, risks, security and privacy concerns of performing an evaluation and management service by telephone and the availability of in person appointments. I also discussed with the patient that there may be a patient responsible charge related to this service. The patient expressed understanding and agreed to proceed.  I explained I am completing New OB Intake today. We discussed EDD of 09/17/2023, by Last Menstrual Period. Pt is [id number]. I reviewed her allergies, medications and Medical/Surgical/OB history.    Patient Active Problem List   Diagnosis Date Noted   Acute blood loss anemia 08/16/2022   S/P cesarean section 08/16/2022   H/O: depression 08/16/2022   History of cesarean section complicating pregnancy 08/14/2022   Previous cesarean section 08/14/2022   Postpartum care following cesarean delivery 08/14/2022   Pelvic pain 12/23/2018   Current moderate episode of major depressive disorder (HCC) 06/20/2018    Concerns addressed today  Delivery Plans Plans to deliver at [facility]. Discussed the nature of our practice with multiple providers including residents and students. Due to the size of the practice, the delivering provider may not be the same as those providing prenatal care.   Patient is not a candidate for water birth. Offered upcoming OB visit with CNM to discuss further.  MyChart/Babyscripts MyChart access verified. I explained pt will have some visits in office and some virtually. Babyscripts instructions given and order placed. Patient verifies receipt of registration text/e-mail. Account successfully created and app downloaded.  Blood Pressure Cuff/Weight Scale Has BP cuff at home. Explained after first prenatal  appt pt will check weekly and document in Babyscripts. Patient does not have weight scale; patient may purchase if they desire to track weight weekly in Babyscripts.  Anatomy US Explained first scheduled [place] will be around 19 weeks. Anatomy US scheduled for 04/23/23 at 9:15.  Interested in [place]? If yes, send referral and doula dot phrase.   Is patient a candidate for Babyscripts Optimization? No - High Risk  First visit review I reviewed new OB appt with patient. Explained pt will be seen by Deanna May, CNM at first visit. Discussed Deanna May genetic screening with patient. Requests Panor[employer] and [employer].. Routine prenatal labs  collected at today's visit.    Last Pap No results found for: "DIAGPAP"  Deanna Lemon, RN 02/25/2023  8:15 AM

## 2023-02-26 LAB — CERVICOVAGINAL ANCILLARY ONLY
Bacterial Vaginitis (gardnerella): POSITIVE — AB
Candida Glabrata: NEGATIVE
Candida Vaginitis: NEGATIVE
Chlamydia: NEGATIVE
Comment: NEGATIVE
Comment: NEGATIVE
Comment: NEGATIVE
Comment: NEGATIVE
Comment: NEGATIVE
Comment: NORMAL
Neisseria Gonorrhea: NEGATIVE
Trichomonas: NEGATIVE

## 2023-02-26 LAB — CBC/D/PLT+RPR+RH+ABO+RUBIGG...
Antibody Screen: NEGATIVE
Basophils Absolute: 0.2 10*3/uL (ref 0.0–0.2)
Basos: 3 %
EOS (ABSOLUTE): 0.2 10*3/uL (ref 0.0–0.4)
Eos: 4 %
HCV Ab: NONREACTIVE
HIV Screen 4th Generation wRfx: NONREACTIVE
Hematocrit: 35.1 % (ref 34.0–46.6)
Hemoglobin: 11.1 g/dL (ref 11.1–15.9)
Hepatitis B Surface Ag: NEGATIVE
Immature Grans (Abs): 0 10*3/uL (ref 0.0–0.1)
Immature Granulocytes: 0 %
Lymphocytes Absolute: 1.8 10*3/uL (ref 0.7–3.1)
Lymphs: 30 %
MCH: 28.3 pg (ref 26.6–33.0)
MCHC: 31.6 g/dL (ref 31.5–35.7)
MCV: 90 fL (ref 79–97)
Monocytes Absolute: 0.4 10*3/uL (ref 0.1–0.9)
Monocytes: 7 %
Neutrophils Absolute: 3.3 10*3/uL (ref 1.4–7.0)
Neutrophils: 56 %
Platelets: 330 10*3/uL (ref 150–450)
RBC: 3.92 x10E6/uL (ref 3.77–5.28)
RDW: 14.9 % (ref 11.7–15.4)
RPR Ser Ql: NONREACTIVE
Rh Factor: POSITIVE
Rubella Antibodies, IGG: 1.01 {index} (ref >0.99)
WBC: 6 10*3/uL (ref 3.4–10.8)

## 2023-02-26 LAB — HCV INTERPRETATION

## 2023-02-27 LAB — URINE CULTURE, OB REFLEX

## 2023-02-27 LAB — CULTURE, OB URINE

## 2023-03-03 NOTE — BH Specialist Note (Signed)
Pt did not arrive to video visit and did not answer the phone; Left HIPPA-compliant message to call back Aryahna Spagna from [facility] at  [phone] (Ilir Mahrt's office).  ?; left MyChart message for patient.  ? ?

## 2023-03-05 LAB — PANORAMA PRENATAL TEST FULL PANEL:PANORAMA TEST PLUS 5 ADDITIONAL MICRODELETIONS: FETAL FRACTION: 6.5
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
## 2023-03-17 ENCOUNTER — Ambulatory Visit: Payer: Medicaid Other | Admitting: Clinical

## 2023-03-23 ENCOUNTER — Ambulatory Visit: Payer: Medicaid Other | Admitting: Family Medicine

## 2023-03-30 ENCOUNTER — Encounter: Payer: Self-pay | Admitting: Advanced Practice Midwife

## 2023-03-30 ENCOUNTER — Ambulatory Visit: Payer: Medicaid Other | Admitting: Advanced Practice Midwife

## 2023-03-30 VITALS — BP 112/72 | HR 76 | Wt 167.6 lb

## 2023-03-30 DIAGNOSIS — Z3A15 15 weeks gestation of pregnancy: Secondary | ICD-10-CM | POA: Diagnosis not present

## 2023-03-30 DIAGNOSIS — O0990 Supervision of high risk pregnancy, unspecified, unspecified trimester: Secondary | ICD-10-CM

## 2023-03-30 DIAGNOSIS — Z98891 History of uterine scar from previous surgery: Secondary | ICD-10-CM | POA: Diagnosis not present

## 2023-03-30 DIAGNOSIS — O0992 Supervision of high risk pregnancy, unspecified, second trimester: Secondary | ICD-10-CM | POA: Diagnosis not present

## 2023-03-30 DIAGNOSIS — O34219 Maternal care for unspecified type scar from previous cesarean delivery: Secondary | ICD-10-CM | POA: Diagnosis not present

## 2023-03-30 NOTE — Progress Notes (Signed)
Pt presents for NOB visit. Last PAP 09/2022 No concerns.

## 2023-03-30 NOTE — Progress Notes (Signed)
   PRENATAL VISIT NOTE  Subjective:  Deanna May is a 32 y.o. [id number] at [redacted]w[redacted]d being seen today for ongoing prenatal care.  She is currently monitored for the following issues for this high-risk pregnancy and has Current moderate episode of major depressive disorder (HCC); Pelvic pain; History of cesarean section complicating pregnancy; Previous cesarean section; Postpartum care following cesarean delivery; Acute blood loss anemia; S/P cesarean section; H/O: depression; and Supervision of high risk pregnancy, antepartum on their problem list.  Patient reports no complaints.  Contractions: Not present. Vag. Bleeding: None.  Movement: Absent. Denies leaking of fluid.   The following portions of the patient's history were reviewed and updated as appropriate: allergies, current medications, past family history, past medical history, past social history, past surgical history and problem list.   Objective:   Vitals:   03/30/23 1613  BP: 112/72  Pulse: 76  Weight: 167 lb 9.6 oz (76 kg)    Fetal Status: Fetal Heart Rate (bpm): 147   Movement: Absent     General:  Alert, oriented and cooperative. Patient is in no acute distress.  Skin: Skin is warm and dry. No rash noted.   Cardiovascular: Normal heart rate noted  Respiratory: Normal respiratory effort, no problems with respiration noted  Abdomen: Soft, gravid, appropriate for gestational age.  Pain/Pressure: Absent     Pelvic: Cervical exam deferred        Extremities: Normal range of motion.  Edema: None  Mental Status: Normal mood and affect. Normal behavior. Normal judgment and thought content.   Assessment and Plan:  Pregnancy: [id number] at [redacted]w[redacted]d 1. Supervision of high risk pregnancy, antepartum --Anticipatory guidance about next visits/weeks of pregnancy given.  - AFP, Serum, Open Spina Bifida  2. Previous cesarean section --Previous C/S x 5  3. [redacted] weeks gestation of pregnancy    Preterm labor symptoms and general obstetric  precautions including but not limited to vaginal bleeding, contractions, leaking of fluid and fetal movement were reviewed in detail with the patient. Please refer to After Visit Summary for other counseling recommendations.   Return in about 4 weeks (around 04/27/2023) for MD only.  Future Appointments  Date Time Provider Department Center  04/23/2023  9:15 AM [facility] NURSE [facility]  04/23/2023  9:30 AM [facility] US4 [facility]  04/27/2023  3:30 PM [place]Kir[place], Wilmer Floor, CNM [facility] None    Sharen Counter, CNM

## 2023-04-01 LAB — AFP, SERUM, OPEN SPINA BIFIDA
AFP MoM: 1.45
AFP Value: 43.2 ng/mL
Gest. Age on Collection Date: 15 wk
Maternal Age At EDD: 32.8 a
OSBR Risk 1 IN: 6265
Test Results:: NEGATIVE
Weight: 167 [lb_av]

## 2023-04-13 DIAGNOSIS — O208 Other hemorrhage in early pregnancy: Secondary | ICD-10-CM | POA: Insufficient documentation

## 2023-04-23 ENCOUNTER — Other Ambulatory Visit: Payer: Self-pay | Admitting: *Deleted

## 2023-04-23 ENCOUNTER — Ambulatory Visit: Payer: Medicaid Other

## 2023-04-23 ENCOUNTER — Ambulatory Visit: Payer: Medicaid Other | Attending: Obstetrics & Gynecology

## 2023-04-23 DIAGNOSIS — O34219 Maternal care for unspecified type scar from previous cesarean delivery: Secondary | ICD-10-CM

## 2023-04-23 DIAGNOSIS — O35BXX0 Maternal care for other (suspected) fetal abnormality and damage, fetal cardiac anomalies, not applicable or unspecified: Secondary | ICD-10-CM

## 2023-04-23 DIAGNOSIS — O0990 Supervision of high risk pregnancy, unspecified, unspecified trimester: Secondary | ICD-10-CM

## 2023-04-23 DIAGNOSIS — Z3A18 18 weeks gestation of pregnancy: Secondary | ICD-10-CM | POA: Diagnosis not present

## 2023-04-23 DIAGNOSIS — O0942 Supervision of pregnancy with grand multiparity, second trimester: Secondary | ICD-10-CM

## 2023-04-23 DIAGNOSIS — Z362 Encounter for other antenatal screening follow-up: Secondary | ICD-10-CM

## 2023-04-23 DIAGNOSIS — O208 Other hemorrhage in early pregnancy: Secondary | ICD-10-CM

## 2023-04-27 ENCOUNTER — Encounter: Payer: Medicaid Other | Admitting: Advanced Practice Midwife

## 2023-04-28 ENCOUNTER — Encounter: Payer: Self-pay | Admitting: Obstetrics

## 2023-04-28 ENCOUNTER — Ambulatory Visit: Payer: Medicaid Other | Admitting: Obstetrics

## 2023-04-28 ENCOUNTER — Encounter: Payer: Medicaid Other | Admitting: Obstetrics and Gynecology

## 2023-04-28 ENCOUNTER — Other Ambulatory Visit (HOSPITAL_COMMUNITY)
Admission: RE | Admit: 2023-04-28 | Discharge: 2023-04-28 | Disposition: A | Discharge status: Home / Self Care | Payer: Medicaid Other | Source: Ambulatory Visit | Attending: Obstetrics | Admitting: Obstetrics

## 2023-04-28 VITALS — BP 126/63 | HR 87 | Wt 176.9 lb

## 2023-04-28 DIAGNOSIS — Z3A19 19 weeks gestation of pregnancy: Secondary | ICD-10-CM | POA: Insufficient documentation

## 2023-04-28 DIAGNOSIS — Z3482 Encounter for supervision of other normal pregnancy, second trimester: Secondary | ICD-10-CM | POA: Diagnosis not present

## 2023-04-28 DIAGNOSIS — N898 Other specified noninflammatory disorders of vagina: Secondary | ICD-10-CM | POA: Insufficient documentation

## 2023-04-28 NOTE — Progress Notes (Signed)
Pt presents for ROB visit. Pt c/o vaginal irritation

## 2023-04-28 NOTE — Progress Notes (Signed)
Subjective:  Deanna May is a 32 y.o. [id number] at [redacted]w[redacted]d being seen today for ongoing prenatal care.  She is currently monitored for the following issues for this low-risk pregnancy and has History of cesarean section complicating pregnancy; Supervision of high risk pregnancy, antepartum; and [facility] first trimester on their problem list.  Patient reports no complaints.  Contractions: Not present. Vag. Bleeding: None.  Movement: Present. Denies leaking of fluid.   The following portions of the patient's history were reviewed and updated as appropriate: allergies, current medications, past family history, past medical history, past social history, past surgical history and problem list. Problem list updated.  Objective:   Vitals:   04/28/23 1514  BP: 126/63  Pulse: 87  Weight: 176 lb 14.4 oz (80.2 kg)    Fetal Status: Fetal Heart Rate (bpm): 140   Movement: Present     General:  Alert, oriented and cooperative. Patient is in no acute distress.  Skin: Skin is warm and dry. No rash noted.   Cardiovascular: Normal heart rate noted  Respiratory: Normal respiratory effort, no problems with respiration noted  Abdomen: Soft, gravid, appropriate for gestational age. Pain/Pressure: Absent     Pelvic:  Cervical exam deferred        Extremities: Normal range of motion.  Edema: None  Mental Status: Normal mood and affect. Normal behavior. Normal judgment and thought content.   Urinalysis:      Assessment and Plan:  Pregnancy: [id number] at [redacted]w[redacted]d  - Cervicovaginal ancillary only( [place])  There are no diagnoses linked to this encounter. Preterm labor symptoms and general obstetric precautions including but not limited to vaginal bleeding, contractions, leaking of fluid and fetal movement were reviewed in detail with the patient. Please refer to After Visit Summary for other counseling recommendations.   Return in about 4 weeks (around 05/26/2023) for ROB.   Brock Bad,  MD 04/28/2023

## 2023-04-30 LAB — CERVICOVAGINAL ANCILLARY ONLY
Bacterial Vaginitis (gardnerella): NEGATIVE
Candida Glabrata: NEGATIVE
Candida Vaginitis: NEGATIVE
Chlamydia: NEGATIVE
Comment: NEGATIVE
Comment: NEGATIVE
Comment: NEGATIVE
Comment: NEGATIVE
Comment: NEGATIVE
Comment: NORMAL
Neisseria Gonorrhea: NEGATIVE
Trichomonas: NEGATIVE

## 2023-05-09 ENCOUNTER — Inpatient Hospital Stay (HOSPITAL_COMMUNITY)
Admission: AD | Admit: 2023-05-09 | Discharge: 2023-05-09 | Disposition: A | Discharge status: Home / Self Care | Payer: Medicaid Other | Attending: Obstetrics and Gynecology | Admitting: Obstetrics and Gynecology

## 2023-05-09 ENCOUNTER — Other Ambulatory Visit: Payer: Self-pay

## 2023-05-09 ENCOUNTER — Encounter (HOSPITAL_COMMUNITY): Payer: Self-pay | Admitting: Obstetrics and Gynecology

## 2023-05-09 DIAGNOSIS — O212 Late vomiting of pregnancy: Secondary | ICD-10-CM | POA: Diagnosis not present

## 2023-05-09 DIAGNOSIS — Z3A21 21 weeks gestation of pregnancy: Secondary | ICD-10-CM | POA: Insufficient documentation

## 2023-05-09 DIAGNOSIS — O26892 Other specified pregnancy related conditions, second trimester: Secondary | ICD-10-CM | POA: Diagnosis present

## 2023-05-09 DIAGNOSIS — R109 Unspecified abdominal pain: Secondary | ICD-10-CM | POA: Insufficient documentation

## 2023-05-09 DIAGNOSIS — O219 Vomiting of pregnancy, unspecified: Secondary | ICD-10-CM

## 2023-05-09 LAB — URINALYSIS, ROUTINE W REFLEX MICROSCOPIC
Bilirubin Urine: NEGATIVE
Glucose, UA: NEGATIVE mg/dL
Hgb urine dipstick: NEGATIVE
Ketones, ur: NEGATIVE mg/dL
Leukocytes,Ua: NEGATIVE
Nitrite: NEGATIVE
Protein, ur: 30 mg/dL — AB
Specific Gravity, Urine: 1.028 (ref 1.005–1.030)
pH: 5 (ref 5.0–8.0)

## 2023-05-09 MED ORDER — ONDANSETRON 4 MG PO TBDP: 4.0000 mg | ORAL_TABLET | Freq: Three times a day (TID) | ORAL | 0 refills | Status: DC | PRN

## 2023-05-09 MED ORDER — PROMETHAZINE HCL 25 MG RE SUPP
25.0000 mg | Freq: Four times a day (QID) | RECTAL | Status: DC | PRN
  Administered 2023-05-09: 25 mg via RECTAL
  Filled 2023-05-09: qty 1

## 2023-05-09 MED ORDER — SCOPOLAMINE 1 MG/3DAYS TD PT72
1.0000 | MEDICATED_PATCH | TRANSDERMAL | Status: DC
  Administered 2023-05-09: 1.5 mg via TRANSDERMAL
  Filled 2023-05-09: qty 1

## 2023-05-09 MED ORDER — ONDANSETRON 4 MG PO TBDP
8.0000 mg | ORAL_TABLET | Freq: Once | ORAL | Status: AC
Start: 2023-05-09 — End: 2023-05-09
  Administered 2023-05-09: 8 mg via ORAL
  Filled 2023-05-09: qty 2

## 2023-05-09 NOTE — MAU Note (Signed)
.  Deanna May is a 32 y.o. at [redacted]w[redacted]d here in MAU reporting: lower abd cramping that started around 8pm last night.  Pt states pain is constant and got worse around 0500 this morning, reports she started vomiting about an hour ago, has had about 5 episodes of emesis. Denies vag bleeding or LOF  Onset of complaint: 2000 12/7 Pain score: 7/10 Vitals:   05/09/23 1117  BP: 125/71  Pulse: (!) 107  Resp: 16  Temp: 98 F (36.7 C)  SpO2: 99%     FHT:135 Lab orders placed from triage:   ua

## 2023-05-09 NOTE — MAU Provider Note (Addendum)
Chief Complaint:  Abdominal Pain and Emesis   HPI   Event Date/Time   First Provider Initiated Contact with Patient 05/09/23 1139      Deanna May is a 32 y.o. [id number] at [redacted]w[redacted]d who presents to maternity admissions reporting stomach pain rated 6/10 and vomiting.Stomach cramps began yesterday and increased in pain around 0500 today. She reports she began vomiting around 9am and has vomited 5-6 times. She reports no fever and has a slight headache. She reports she had something to eat last around midnight and last had something to drink at 0500. She reports her daughter was sick and vomiting yesterday. She reports she has not taken anything for her symptoms as of yet. Patient receives her care at [facility]; next visit is 05/27/2023.   Pregnancy Course: History of cesarean section x5  Past Medical History:  Diagnosis Date   Depression 2018   Headache    OB History  Gravida Para Term Preterm AB Living  7 5 5   1 4   SAB IAB Ectopic Multiple Live Births    1   0 5    # Outcome Date GA Lbr Len/2nd Weight Sex Type Anes PTL Lv  7 Current           6 Term 08/14/22 [redacted]w[redacted]d  3570 g F CS-LTranv Spinal, EPI  DEC  5 IAB 2018 [redacted]w[redacted]d         4 Term 09/23/15 [redacted]w[redacted]d    CS-Unspec   LIV  3 Term 09/16/12     CS-Unspec   LIV  2 Term 10/30/09 [redacted]w[redacted]d    CS-Unspec   LIV  1 Term 10/16/07 [redacted]w[redacted]d    CS-Unspec   LIV   Past Surgical History:  Procedure Laterality Date   CESAREAN SECTION  2009, 2011, 2014, 2017   CESAREAN SECTION WITH BILATERAL TUBAL LIGATION N/A 08/14/2022   Procedure was C/S only, NO BTL Surgeon: Jackie Plum, MD   Family History  Problem Relation Age of Onset   Asthma Sister    Allergies Sister    Autism spectrum disorder Son    Autism spectrum disorder Son    Social History   Tobacco Use   Smoking status: Never   Smokeless tobacco: Never  Vaping Use   Vaping status: Never Used  Substance Use Topics   Alcohol use: Not Currently   Drug use: Not Currently    Types: Marijuana    No Known Allergies Medications Prior to Admission  Medication Sig Dispense Refill Last Dose   Prenatal Vit-Fe Fumarate-FA (PRENATAL MULTIVITAMIN) TABS tablet Take 1 tablet by mouth daily at 12 noon.   Past Week   Cholecalciferol (VITAMIN D3) 1000 units CAPS Take 2 capsules by mouth daily. (Patient not taking: Reported on 02/25/2023)   Unknown   hydrocortisone cream 1 % Apply to affected area 2 times daily (Patient not taking: Reported on 03/30/2023) 15 g 0 Unknown   iron polysaccharides (NIFEREX) 150 MG capsule Take 1 capsule (150 mg total) by mouth daily. (Patient not taking: Reported on 02/25/2023) 30 capsule 1 Unknown   Prenatal Vit-Fe Fumarate-FA (PRENATAL VITAMIN PLUS LOW IRON) 27-1 MG TABS Take 1 tablet by mouth daily. 30 tablet 12 Unknown   senna-docusate (SENOKOT-S) 8.6-50 MG tablet Take 2 tablets by mouth daily. (Patient not taking: Reported on 02/25/2023) 30 tablet 0 Unknown    I have reviewed patient's Past Medical Hx, Surgical Hx, Family Hx, Social Hx, medications and allergies.   ROS  Pertinent items noted in HPI and  remainder of comprehensive ROS otherwise negative.   PHYSICAL EXAM  Patient Vitals for the past 24 hrs:  BP Temp Temp src Pulse Resp SpO2 Height Weight  05/09/23 1135 115/63 -- -- (!) 104 -- 100 % -- --  05/09/23 1131 119/60 -- -- (!) 105 -- -- -- --  05/09/23 1121 -- -- -- -- -- -- 5\' 9"  (1.753 m) 79.2 kg  05/09/23 1117 125/71 98 F (36.7 C) Oral (!) 107 16 99 % -- --    Constitutional: Well-developed, well-nourished female in no acute distress.  Cardiovascular: normal rate & rhythm, warm and well-perfused Respiratory: normal effort, no problems with respiration noted GI: Abd soft, non-tender, non-distended MS: Extremities nontender, no edema, normal ROM Neurologic: Alert and oriented x 4.  GU: no CVA tenderness    Labs: Results for orders placed or performed during the hospital encounter of 05/09/23 (from the past 24 hour(s))  Urinalysis, Routine w  reflex microscopic -Urine, Clean Catch     Status: Abnormal   Collection Time: 05/09/23  1:25 PM  Result Value Ref Range   Color, Urine YELLOW YELLOW   APPearance HAZY (A) CLEAR   Specific Gravity, Urine 1.028 1.005 - 1.030   pH 5.0 5.0 - 8.0   Glucose, UA NEGATIVE NEGATIVE mg/dL   Hgb urine dipstick NEGATIVE NEGATIVE   Bilirubin Urine NEGATIVE NEGATIVE   Ketones, ur NEGATIVE NEGATIVE mg/dL   Protein, ur 30 (A) NEGATIVE mg/dL   Nitrite NEGATIVE NEGATIVE   Leukocytes,Ua NEGATIVE NEGATIVE   RBC / HPF 0-5 0 - 5 RBC/hpf   WBC, UA 0-5 0 - 5 WBC/hpf   Bacteria, UA RARE (A) NONE SEEN   Squamous Epithelial / HPF 6-10 0 - 5 /HPF   Mucus PRESENT     Imaging:  No results found.  MDM & MAU COURSE  MDM:  MAU Course: Orders Placed This Encounter  Procedures   Urinalysis, Routine w reflex microscopic -Urine, Clean Catch   Discharge patient Discharge disposition: 01-Home or Self Care; Discharge patient date: 05/09/2023   Meds ordered this encounter  Medications   promethazine (PHENERGAN) suppository 25 mg   ondansetron (ZOFRAN-ODT) disintegrating tablet 8 mg   scopolamine (TRANSDERM-SCOP) 1 MG/3DAYS 1.5 mg   ondansetron (ZOFRAN-ODT) 4 MG disintegrating tablet    Sig: Take 1 tablet (4 mg total) by mouth every 8 (eight) hours as needed for nausea or vomiting.    Dispense:  20 tablet    Refill:  0    ASSESSMENT   1. Nausea/vomiting in pregnancy  -prescription sent for ODT 4mg  Zofran PRN sent to preferred pharmacy   2. Stomach cramps  -Discussed GI rest and introducing ice chips, fluids, and solid food slowly   3. [redacted] weeks gestation of pregnancy  -Follow up with [facility] as scheduled on 05/27/23     PLAN  Discharge home in stable condition with return precautions. Patient may return to MAU as needed.      Allergies as of 05/09/2023   No Known Allergies      Medication List     TAKE these medications    hydrocortisone cream 1 % Apply to affected area 2 times daily   iron  polysaccharides 150 MG capsule Commonly known as: NIFEREX Take 1 capsule (150 mg total) by mouth daily.   ondansetron 4 MG disintegrating tablet Commonly known as: ZOFRAN-ODT Take 1 tablet (4 mg total) by mouth every 8 (eight) hours as needed for nausea or vomiting.   prenatal multivitamin Tabs tablet Take  1 tablet by mouth daily at 12 noon.   Prenatal Vitamin Plus Low Iron 27-1 MG Tabs Take 1 tablet by mouth daily.   senna-docusate 8.6-50 MG tablet Commonly known as: Senokot-S Take 2 tablets by mouth daily.   Vitamin D3 1000 units Caps Take 2 capsules by mouth daily.       Dia Sitter [facility]  05/09/2023 1202pm   Attestation of Supervision of Student:  I confirm that I have verified the information documented in the nurse midwife student's note and that I have also personally reperformed the history, physical exam and all medical decision making activities.  I have verified that all services and findings are accurately documented in this student's note; and I agree with management and plan as outlined in the documentation. I have also made any necessary editorial changes.   Raelyn Mora, CNM Center for [employer], [facility] 05/09/2023 5:39 PM

## 2023-05-20 ENCOUNTER — Encounter: Payer: Self-pay | Admitting: *Deleted

## 2023-05-20 DIAGNOSIS — O283 Abnormal ultrasonic finding on antenatal screening of mother: Secondary | ICD-10-CM | POA: Insufficient documentation

## 2023-05-27 ENCOUNTER — Other Ambulatory Visit (HOSPITAL_COMMUNITY)
Admission: RE | Admit: 2023-05-27 | Discharge: 2023-05-27 | Disposition: A | Discharge status: Home / Self Care | Payer: Medicaid Other | Source: Ambulatory Visit | Attending: Obstetrics and Gynecology | Admitting: Obstetrics and Gynecology

## 2023-05-27 ENCOUNTER — Ambulatory Visit (INDEPENDENT_AMBULATORY_CARE_PROVIDER_SITE_OTHER): Payer: Medicaid Other | Admitting: Obstetrics and Gynecology

## 2023-05-27 VITALS — BP 108/67 | HR 82 | Wt 178.0 lb

## 2023-05-27 DIAGNOSIS — N898 Other specified noninflammatory disorders of vagina: Secondary | ICD-10-CM | POA: Diagnosis present

## 2023-05-27 DIAGNOSIS — O34219 Maternal care for unspecified type scar from previous cesarean delivery: Secondary | ICD-10-CM

## 2023-05-27 DIAGNOSIS — Z3009 Encounter for other general counseling and advice on contraception: Secondary | ICD-10-CM

## 2023-05-27 DIAGNOSIS — O0990 Supervision of high risk pregnancy, unspecified, unspecified trimester: Secondary | ICD-10-CM

## 2023-05-27 DIAGNOSIS — Z3A23 23 weeks gestation of pregnancy: Secondary | ICD-10-CM

## 2023-05-27 DIAGNOSIS — O283 Abnormal ultrasonic finding on antenatal screening of mother: Secondary | ICD-10-CM

## 2023-05-27 NOTE — Progress Notes (Signed)
Pt complains of vaginal discharge with some itching- will do self swab today.    Pt is schedule for follow up u/s tomorrow.

## 2023-05-27 NOTE — Progress Notes (Signed)
   PRENATAL VISIT NOTE  Subjective:  Deanna May is a 32 y.o. [id number] at [redacted]w[redacted]d being seen today for ongoing prenatal care.  She is currently monitored for the following issues for this high-risk pregnancy and has history of cesarean x 5; Supervision of high risk pregnancy, antepartum; Sm-mod [facility] first trimester; Grand multipara in labor in second trimester; Echogenic intracardiac focus of fetus on prenatal ultrasound; and Unwanted fertility on their problem list.  Patient doing well with no acute concerns today. She reports no complaints.  Contractions: Not present. Vag. Bleeding: None.  Movement: Present. Denies leaking of fluid.   The following portions of the patient's history were reviewed and updated as appropriate: allergies, current medications, past family history, past medical history, past social history, past surgical history and problem list. Problem list updated.  Objective:   Vitals:   05/27/23 1522  BP: 108/67  Pulse: 82  Weight: 178 lb (80.7 kg)    Fetal Status: Fetal Heart Rate (bpm): 135 Fundal Height: 24 cm Movement: Present     General:  Alert, oriented and cooperative. Patient is in no acute distress.  Skin: Skin is warm and dry. No rash noted.   Cardiovascular: Normal heart rate noted  Respiratory: Normal respiratory effort, no problems with respiration noted  Abdomen: Soft, gravid, appropriate for gestational age.  Pain/Pressure: Present     Pelvic: Cervical exam deferred        Extremities: Normal range of motion.     Mental Status:  Normal mood and affect. Normal behavior. Normal judgment and thought content.   Assessment and Plan:  Pregnancy: [id number] at [redacted]w[redacted]d  1. [redacted] weeks gestation of pregnancy (Primary)   2. Vaginal itching Self swab performed today, results pending - Cervicovaginal ancillary only( )  3. Echogenic intracardiac focus of fetus on prenatal ultrasound Follow up u/s 05/28/23  4. History of cesarean section complicating  pregnancy Hx of c/s x 5, pt will need repeat  5. Supervision of high risk pregnancy, antepartum Continue routine prenatal care  6. Unwanted fertility Sign BTL form at 28 week visit  Preterm labor symptoms and general obstetric precautions including but not limited to vaginal bleeding, contractions, leaking of fluid and fetal movement were reviewed in detail with the patient.  Please refer to After Visit Summary for other counseling recommendations.   Return in about 4 weeks (around 06/24/2023) for [facility], in person, 2 hr GTT, 3rd trim labs.   Mariel Aloe, MD Faculty Attending Center for [facility]

## 2023-05-28 ENCOUNTER — Ambulatory Visit: Payer: Medicaid Other | Admitting: *Deleted

## 2023-05-28 ENCOUNTER — Other Ambulatory Visit: Payer: Self-pay | Admitting: *Deleted

## 2023-05-28 ENCOUNTER — Ambulatory Visit: Payer: Medicaid Other | Attending: Obstetrics

## 2023-05-28 VITALS — BP 119/60 | HR 77

## 2023-05-28 DIAGNOSIS — O0942 Supervision of pregnancy with grand multiparity, second trimester: Secondary | ICD-10-CM

## 2023-05-28 DIAGNOSIS — O35BXX0 Maternal care for other (suspected) fetal abnormality and damage, fetal cardiac anomalies, not applicable or unspecified: Secondary | ICD-10-CM

## 2023-05-28 DIAGNOSIS — Z3A24 24 weeks gestation of pregnancy: Secondary | ICD-10-CM

## 2023-05-28 DIAGNOSIS — O34219 Maternal care for unspecified type scar from previous cesarean delivery: Secondary | ICD-10-CM | POA: Insufficient documentation

## 2023-05-28 DIAGNOSIS — Z362 Encounter for other antenatal screening follow-up: Secondary | ICD-10-CM | POA: Insufficient documentation

## 2023-05-28 DIAGNOSIS — O402XX0 Polyhydramnios, second trimester, not applicable or unspecified: Secondary | ICD-10-CM | POA: Diagnosis not present

## 2023-05-28 DIAGNOSIS — O0990 Supervision of high risk pregnancy, unspecified, unspecified trimester: Secondary | ICD-10-CM

## 2023-05-28 LAB — CERVICOVAGINAL ANCILLARY ONLY
Bacterial Vaginitis (gardnerella): NEGATIVE
Candida Glabrata: NEGATIVE
Candida Vaginitis: NEGATIVE
Comment: NEGATIVE
Comment: NEGATIVE
Comment: NEGATIVE
Comment: NEGATIVE
Trichomonas: NEGATIVE

## 2023-06-21 ENCOUNTER — Telehealth: Payer: Self-pay | Admitting: Licensed Clinical Social Worker

## 2023-06-21 NOTE — Telephone Encounter (Signed)
[facility] contacted patient on this date to provide [facility] intro and schedule an appointment. [facility] left a VM on this date.

## 2023-06-25 ENCOUNTER — Ambulatory Visit (INDEPENDENT_AMBULATORY_CARE_PROVIDER_SITE_OTHER): Payer: Medicaid Other | Admitting: Obstetrics and Gynecology

## 2023-06-25 ENCOUNTER — Other Ambulatory Visit (HOSPITAL_COMMUNITY)
Admission: RE | Admit: 2023-06-25 | Discharge: 2023-06-25 | Disposition: A | Discharge status: Home / Self Care | Payer: Medicaid Other | Source: Ambulatory Visit | Attending: Obstetrics and Gynecology | Admitting: Obstetrics and Gynecology

## 2023-06-25 ENCOUNTER — Encounter: Payer: Self-pay | Admitting: *Deleted

## 2023-06-25 ENCOUNTER — Other Ambulatory Visit: Payer: Medicaid Other

## 2023-06-25 VITALS — BP 118/71 | HR 81 | Wt 189.8 lb

## 2023-06-25 DIAGNOSIS — O34219 Maternal care for unspecified type scar from previous cesarean delivery: Secondary | ICD-10-CM

## 2023-06-25 DIAGNOSIS — N898 Other specified noninflammatory disorders of vagina: Secondary | ICD-10-CM | POA: Insufficient documentation

## 2023-06-25 DIAGNOSIS — O99013 Anemia complicating pregnancy, third trimester: Secondary | ICD-10-CM

## 2023-06-25 DIAGNOSIS — O0990 Supervision of high risk pregnancy, unspecified, unspecified trimester: Secondary | ICD-10-CM

## 2023-06-25 DIAGNOSIS — O409XX0 Polyhydramnios, unspecified trimester, not applicable or unspecified: Secondary | ICD-10-CM | POA: Insufficient documentation

## 2023-06-25 DIAGNOSIS — O283 Abnormal ultrasonic finding on antenatal screening of mother: Secondary | ICD-10-CM

## 2023-06-25 DIAGNOSIS — O402XX0 Polyhydramnios, second trimester, not applicable or unspecified: Secondary | ICD-10-CM

## 2023-06-25 DIAGNOSIS — Z3009 Encounter for other general counseling and advice on contraception: Secondary | ICD-10-CM

## 2023-06-25 DIAGNOSIS — R87618 Other abnormal cytological findings on specimens from cervix uteri: Secondary | ICD-10-CM | POA: Insufficient documentation

## 2023-06-25 DIAGNOSIS — Z3A28 28 weeks gestation of pregnancy: Secondary | ICD-10-CM

## 2023-06-25 NOTE — Progress Notes (Signed)
   PRENATAL VISIT NOTE  Subjective:  Deanna May is a 33 y.o. [id number] at [redacted]w[redacted]d being seen today for ongoing prenatal care.  She is currently monitored for the following issues for this low-risk pregnancy and has history of cesarean x 5; Supervision of high risk pregnancy, antepartum; Echogenic intracardiac focus of fetus on prenatal ultrasound; Pap smear abnormality of cervix/human papillomavirus (HPV) positive; Polyhydramnios affecting pregnancy; and Grand multipara in labor, third trimester on their problem list.  Patient reports  vulvar itching and discharge .  Contractions: Irritability. Vag. Bleeding: None.  Movement: Present. Denies leaking of fluid.   The following portions of the patient's history were reviewed and updated as appropriate: allergies, current medications, past family history, past medical history, past social history, past surgical history and problem list.   Objective:   Vitals:   06/25/23 0943  BP: 118/71  Pulse: 81  Weight: 189 lb 12.8 oz (86.1 kg)    Fetal Status: Fetal Heart Rate (bpm): 128   Movement: Present     General:  Alert, oriented and cooperative. Patient is in no acute distress.  Skin: Skin is warm and dry. No rash noted.   Cardiovascular: Normal heart rate noted  Respiratory: Normal respiratory effort, no problems with respiration noted  Abdomen: Soft, gravid, appropriate for gestational age.  Pain/Pressure: Absent      Assessment and Plan:  Pregnancy: [id number] at [redacted]w[redacted]d 1. Supervision of high risk pregnancy, antepartum (Primary) 2. [redacted] weeks gestation of pregnancy 2h GTT, CBC, HIV, RPR today Declined tdap Swab sent for vaginal itching. Discussed changing soap/detergent to help with itching if swab is normal  3. history of cesarean x 5 Op note available from last CS (08/14/22) - thin LUS, EBL ; no significant adhesive disease identified  4. Unwanted fertility - Initially voiced interest in tubal, but she changed her mind. Almost got  tubal last pregnancy. She got pregnant this time unintentionally, but lost her children from her last pregnancy and is thankful she didn't get a tubal then. She does not think she wants more children, but recognizes that procedure is permanent and unexpected life circumstances could happen - Had Mirena IUD in the past and did well with it. Discussed postplacental vs interval placement; she opts for interval placement and declines bridge method  5. Echogenic intracardiac focus of fetus on prenatal ultrasound 7. Mild polyhydramnios LR NIPS MVP 8.21 on [place] 12/27, follow up scheduled 1/31  6. HPV+ pap NILM/HPV+ 09/30/22 with [facility] Will need repeat pap postpartum  Please refer to After Visit Summary for other counseling recommendations.   Return in about 2 weeks (around 07/09/2023) for return OB at 30 weeks.  Future Appointments  Date Time Provider Department Center  07/02/2023  1:15 PM [facility] NURSE [facility]  07/02/2023  1:30 PM [facility] US6 [facility]  07/08/2023  3:50 PM Raelyn Mora, CNM [facility] None   Lennart Pall, MD

## 2023-06-25 NOTE — Progress Notes (Signed)
Pt presents for ROB. Reports vaginal itching.

## 2023-06-26 LAB — GLUCOSE TOLERANCE, 2 HOURS W/ 1HR
Glucose, 1 hour: 118 mg/dL (ref 70–179)
Glucose, 2 hour: 85 mg/dL (ref 70–152)
Glucose, Fasting: 72 mg/dL (ref 70–91)

## 2023-06-26 LAB — CBC
Hematocrit: 27.8 % — ABNORMAL LOW (ref 34.0–46.6)
Hemoglobin: 8.9 g/dL — ABNORMAL LOW (ref 11.1–15.9)
MCH: 27.5 pg (ref 26.6–33.0)
MCHC: 32 g/dL (ref 31.5–35.7)
MCV: 86 fL (ref 79–97)
Platelets: 348 10*3/uL (ref 150–450)
RBC: 3.24 x10E6/uL — ABNORMAL LOW (ref 3.77–5.28)
RDW: 13.1 % (ref 11.7–15.4)
WBC: 8.7 10*3/uL (ref 3.4–10.8)

## 2023-06-26 LAB — RPR: RPR Ser Ql: NONREACTIVE

## 2023-06-26 LAB — HIV ANTIBODY (ROUTINE TESTING W REFLEX): HIV Screen 4th Generation wRfx: NONREACTIVE

## 2023-06-27 LAB — CERVICOVAGINAL ANCILLARY ONLY
Bacterial Vaginitis (gardnerella): NEGATIVE
Candida Glabrata: NEGATIVE
Candida Vaginitis: NEGATIVE
Chlamydia: NEGATIVE
Comment: NEGATIVE
Comment: NEGATIVE
Comment: NEGATIVE
Comment: NEGATIVE
Comment: NEGATIVE
Comment: NORMAL
Neisseria Gonorrhea: NEGATIVE
Trichomonas: NEGATIVE

## 2023-06-28 ENCOUNTER — Encounter: Payer: Self-pay | Admitting: Obstetrics and Gynecology

## 2023-06-28 DIAGNOSIS — O99013 Anemia complicating pregnancy, third trimester: Secondary | ICD-10-CM | POA: Insufficient documentation

## 2023-06-28 MED ORDER — FERROUS SULFATE 325 (65 FE) MG PO TABS: 325.0000 mg | ORAL_TABLET | ORAL | 1 refills | Status: AC

## 2023-06-28 NOTE — Addendum Note (Signed)
Addended by: Harvie Bridge on: 06/28/2023 11:18 AM   Modules accepted: Orders

## 2023-07-02 ENCOUNTER — Ambulatory Visit: Payer: Medicaid Other | Attending: Maternal & Fetal Medicine

## 2023-07-02 ENCOUNTER — Other Ambulatory Visit: Payer: Self-pay | Admitting: *Deleted

## 2023-07-02 ENCOUNTER — Ambulatory Visit: Payer: Medicaid Other | Admitting: *Deleted

## 2023-07-02 VITALS — BP 117/62 | HR 84

## 2023-07-02 DIAGNOSIS — O34219 Maternal care for unspecified type scar from previous cesarean delivery: Secondary | ICD-10-CM | POA: Diagnosis present

## 2023-07-02 DIAGNOSIS — O0990 Supervision of high risk pregnancy, unspecified, unspecified trimester: Secondary | ICD-10-CM | POA: Insufficient documentation

## 2023-07-02 DIAGNOSIS — O0943 Supervision of pregnancy with grand multiparity, third trimester: Secondary | ICD-10-CM | POA: Diagnosis not present

## 2023-07-02 DIAGNOSIS — O409XX0 Polyhydramnios, unspecified trimester, not applicable or unspecified: Secondary | ICD-10-CM

## 2023-07-02 DIAGNOSIS — O35BXX0 Maternal care for other (suspected) fetal abnormality and damage, fetal cardiac anomalies, not applicable or unspecified: Secondary | ICD-10-CM

## 2023-07-02 DIAGNOSIS — Z3A28 28 weeks gestation of pregnancy: Secondary | ICD-10-CM

## 2023-07-02 DIAGNOSIS — O403XX0 Polyhydramnios, third trimester, not applicable or unspecified: Secondary | ICD-10-CM

## 2023-07-02 DIAGNOSIS — O09899 Supervision of other high risk pregnancies, unspecified trimester: Secondary | ICD-10-CM

## 2023-07-08 ENCOUNTER — Encounter: Payer: Self-pay | Admitting: Obstetrics and Gynecology

## 2023-07-08 ENCOUNTER — Ambulatory Visit (INDEPENDENT_AMBULATORY_CARE_PROVIDER_SITE_OTHER): Payer: Medicaid Other | Admitting: Obstetrics and Gynecology

## 2023-07-08 VITALS — BP 110/63 | HR 74 | Wt 185.0 lb

## 2023-07-08 DIAGNOSIS — O34219 Maternal care for unspecified type scar from previous cesarean delivery: Secondary | ICD-10-CM | POA: Diagnosis not present

## 2023-07-08 DIAGNOSIS — O0990 Supervision of high risk pregnancy, unspecified, unspecified trimester: Secondary | ICD-10-CM | POA: Diagnosis not present

## 2023-07-08 DIAGNOSIS — Z3A29 29 weeks gestation of pregnancy: Secondary | ICD-10-CM | POA: Diagnosis not present

## 2023-07-08 NOTE — Progress Notes (Signed)
 Pt presents for ROB visit. No concerns

## 2023-07-08 NOTE — Progress Notes (Signed)
 HIGH-RISK PREGNANCY OFFICE VISIT Patient name: Deanna May MRN [id number]  Date of birth: 11-09-1990 Chief Complaint:   Routine Prenatal Visit  History of Present Illness:   Deanna May is a 33 y.o. [id number] female at 101w6d with an Estimated Date of Delivery: 09/17/23 being seen today for ongoing management of a high-risk pregnancy complicated by  h/o C/S x 5, polyhydramnios and anemia. Today she reports no complaints. Contractions: Irritability. Vag. Bleeding: None.  Movement: Present. denies leaking of fluid.  Review of Systems:   Pertinent items are noted in HPI Denies abnormal vaginal discharge w/ itching/odor/irritation, headaches, visual changes, shortness of breath, chest pain, abdominal pain, severe nausea/vomiting, or problems with urination or bowel movements unless otherwise stated above. Pertinent History Reviewed:  Reviewed past medical,surgical, social, obstetrical and family history.  Reviewed problem list, medications and allergies. Physical Assessment:   Vitals:   07/08/23 1559  BP: 110/63  Pulse: 74  Weight: 185 lb (83.9 kg)  Body mass index is 27.32 kg/m.           Physical Examination:   General appearance: alert, well appearing, and in no distress, oriented to person, place, and time, and normal appearing weight  Mental status: alert, oriented to person, place, and time, normal mood, behavior, speech, dress, motor activity, and thought processes  Skin: warm & dry   Extremities: Edema: None    Cardiovascular: normal heart rate noted  Respiratory: normal respiratory effort, no distress  Abdomen: gravid, soft, non-tender  Pelvic: Cervical exam deferred         Fetal Status: Fetal Heart Rate (bpm): 145 Fundal Height: 32 cm Movement: Present    Fetal Surveillance Testing today: none   No results found for this or any previous visit (from the past 24 hours).  Assessment & Plan:  1) High-risk pregnancy [id number] at [redacted]w[redacted]d with an Estimated Date of Delivery:  09/17/23   2) Supervision of high risk pregnancy, antepartum (Primary) - ROB in 2 weeks with MD ONLY  3) History of cesarean x 5 - RCS  4) [redacted] weeks gestation of pregnancy    Meds: No orders of the defined types were placed in this encounter.   Labs/procedures today: none  Treatment Plan:    Reviewed: Preterm labor symptoms and general obstetric precautions including but not limited to vaginal bleeding, contractions, leaking of fluid and fetal movement were reviewed in detail with the patient.  All questions were answered. Has home bp cuff. Check bp weekly, let us  know if >140/90.   Follow-up: Return in about 2 weeks (around 07/22/2023) for Return OB visit.  No orders of the defined types were placed in this encounter.  Jhostin Epps MSN, CNM 07/08/2023 4:08 PM

## 2023-07-22 ENCOUNTER — Ambulatory Visit (INDEPENDENT_AMBULATORY_CARE_PROVIDER_SITE_OTHER): Payer: Medicaid Other | Admitting: Obstetrics and Gynecology

## 2023-07-22 ENCOUNTER — Encounter: Payer: Self-pay | Admitting: Obstetrics and Gynecology

## 2023-07-22 VITALS — BP 109/64 | HR 87 | Wt 192.0 lb

## 2023-07-22 DIAGNOSIS — O0990 Supervision of high risk pregnancy, unspecified, unspecified trimester: Secondary | ICD-10-CM | POA: Diagnosis not present

## 2023-07-22 DIAGNOSIS — Z3A32 32 weeks gestation of pregnancy: Secondary | ICD-10-CM

## 2023-07-22 DIAGNOSIS — O409XX0 Polyhydramnios, unspecified trimester, not applicable or unspecified: Secondary | ICD-10-CM

## 2023-07-22 DIAGNOSIS — O99013 Anemia complicating pregnancy, third trimester: Secondary | ICD-10-CM

## 2023-07-22 DIAGNOSIS — O34219 Maternal care for unspecified type scar from previous cesarean delivery: Secondary | ICD-10-CM | POA: Diagnosis not present

## 2023-07-22 NOTE — Progress Notes (Addendum)
 Pt is doing well with no concerns today.  ?repeat CBC today - last H&H on 1/24 - 8.9 and 27.8  Pt is taking oral iron

## 2023-07-22 NOTE — Progress Notes (Signed)
   PRENATAL VISIT NOTE  Subjective:  Deanna May is a 33 y.o. F6O1308 at [redacted]w[redacted]d being seen today for ongoing prenatal care.  She is currently monitored for the following issues for this high-risk pregnancy and has history of cesarean x 5; Supervision of high risk pregnancy, antepartum; Echogenic intracardiac focus of fetus on prenatal ultrasound; Pap smear abnormality of cervix/human papillomavirus (HPV) positive; Polyhydramnios affecting pregnancy; Grand multipara in labor, third trimester; and Anemia affecting pregnancy in third trimester on their problem list.  Patient reports no complaints.  Contractions: Irregular. Vag. Bleeding: None.  Movement: Present. Denies leaking of fluid.   The following portions of the patient's history were reviewed and updated as appropriate: allergies, current medications, past family history, past medical history, past social history, past surgical history and problem list.   Objective:   Vitals:   07/22/23 1037  BP: 109/64  Pulse: 87  Weight: 192 lb (87.1 kg)    Fetal Status: Fetal Heart Rate (bpm): 140 Fundal Height: 32 cm Movement: Present     General:  Alert, oriented and cooperative. Patient is in no acute distress.  Skin: Skin is warm and dry. No rash noted.   Cardiovascular: Normal heart rate noted  Respiratory: Normal respiratory effort, no problems with respiration noted  Abdomen: Soft, gravid, appropriate for gestational age.  Pain/Pressure: Absent     Pelvic: Cervical exam deferred        Extremities: Normal range of motion.     Mental Status: Normal mood and affect. Normal behavior. Normal judgment and thought content.   Assessment and Plan:  Pregnancy: [id number] at [redacted]w[redacted]d 1. Supervision of high risk pregnancy, antepartum (Primary) Patient is doing well CBC today - CBC  2. [redacted] weeks gestation of pregnancy - CBC  3. Polyhydramnios affecting pregnancy Resolved on last scan  4. history of cesarean x 5 Will be scheduled at 39  weeks. Patient desires IUD  5. Anemia affecting pregnancy in third trimester CBC today Continue iron supplement  Preterm labor symptoms and general obstetric precautions including but not limited to vaginal bleeding, contractions, leaking of fluid and fetal movement were reviewed in detail with the patient. Please refer to After Visit Summary for other counseling recommendations.   No follow-ups on file.  Future Appointments  Date Time Provider Department Center  08/13/2023 11:15 AM [facility] NURSE [facility]  08/13/2023 11:30 AM [facility] US4 [facility]    Catalina Antigua, MD

## 2023-07-23 ENCOUNTER — Encounter: Payer: Self-pay | Admitting: Obstetrics and Gynecology

## 2023-07-23 ENCOUNTER — Telehealth: Payer: Self-pay | Admitting: Pharmacy Technician

## 2023-07-23 ENCOUNTER — Other Ambulatory Visit: Payer: Self-pay | Admitting: Obstetrics and Gynecology

## 2023-07-23 DIAGNOSIS — Z01812 Encounter for preprocedural laboratory examination: Secondary | ICD-10-CM

## 2023-07-23 LAB — CBC
Hematocrit: 27 % — ABNORMAL LOW (ref 34.0–46.6)
Hemoglobin: 8.8 g/dL — ABNORMAL LOW (ref 11.1–15.9)
MCH: 26.5 pg — ABNORMAL LOW (ref 26.6–33.0)
MCHC: 32.6 g/dL (ref 31.5–35.7)
MCV: 81 fL (ref 79–97)
Platelets: 299 10*3/uL (ref 150–450)
RBC: 3.32 x10E6/uL — ABNORMAL LOW (ref 3.77–5.28)
RDW: 13.6 % (ref 11.7–15.4)
WBC: 9.6 10*3/uL (ref 3.4–10.8)

## 2023-07-23 NOTE — Telephone Encounter (Signed)
 Dr. Jolayne Panther,  FYI note:  patient will be scheduled as soon as possible.  Auth Submission: NO AUTH NEEDED Site of care: Site of care: [facility] Payer: health blue Medication & CPT/J Code(s) submitted: Venofer (Iron Sucrose) J1756 Route of submission (phone, fax, portal):  Phone # Fax # Auth type: Buy/Bill PB Units/visits requested: 2 doses Reference number:  Approval from: 07/23/23 to 12/20/23

## 2023-08-03 ENCOUNTER — Ambulatory Visit: Payer: Medicaid Other

## 2023-08-03 VITALS — BP 104/70 | HR 82 | Temp 98.6°F | Resp 16 | Ht 69.0 in | Wt 195.2 lb

## 2023-08-03 DIAGNOSIS — Z3A33 33 weeks gestation of pregnancy: Secondary | ICD-10-CM | POA: Diagnosis not present

## 2023-08-03 DIAGNOSIS — O99013 Anemia complicating pregnancy, third trimester: Secondary | ICD-10-CM

## 2023-08-03 DIAGNOSIS — D508 Other iron deficiency anemias: Secondary | ICD-10-CM | POA: Diagnosis not present

## 2023-08-03 MED ORDER — DIPHENHYDRAMINE HCL 25 MG PO CAPS
25.0000 mg | ORAL_CAPSULE | Freq: Once | ORAL | Status: AC
  Administered 2023-08-03: 25 mg via ORAL
  Filled 2023-08-03: qty 1

## 2023-08-03 MED ORDER — SODIUM CHLORIDE 0.9 % IV SOLN
500.0000 mg | Freq: Once | INTRAVENOUS | Status: AC
  Administered 2023-08-03: 500 mg via INTRAVENOUS
  Filled 2023-08-03: qty 25

## 2023-08-03 MED ORDER — ACETAMINOPHEN 325 MG PO TABS
650.0000 mg | ORAL_TABLET | Freq: Once | ORAL | Status: AC
  Administered 2023-08-03: 650 mg via ORAL
  Filled 2023-08-03: qty 2

## 2023-08-03 MED ORDER — IRON SUCROSE 500 MG IVPB - SIMPLE MED: 500.0000 mg | Freq: Once | INTRAVENOUS | Status: DC

## 2023-08-03 NOTE — Progress Notes (Signed)
 Diagnosis: Iron Deficiency Anemia  Provider:  Chilton Greathouse MD  Procedure: IV Infusion  IV Type: Peripheral, IV Location: L Antecubital  Venofer (Iron Sucrose), Dose: 500 mg  Infusion Start Time: 0915  Infusion Stop Time: 1315  Post Infusion IV Care: Observation period completed and Peripheral IV Discontinued  Discharge: Condition: Good, Destination: Home . AVS Provided  Performed by:  Loney Hering, LPN

## 2023-08-05 ENCOUNTER — Ambulatory Visit: Payer: Medicaid Other | Admitting: Obstetrics and Gynecology

## 2023-08-05 VITALS — BP 96/53 | HR 78 | Wt 195.0 lb

## 2023-08-05 DIAGNOSIS — O99013 Anemia complicating pregnancy, third trimester: Secondary | ICD-10-CM | POA: Diagnosis not present

## 2023-08-05 DIAGNOSIS — O0990 Supervision of high risk pregnancy, unspecified, unspecified trimester: Secondary | ICD-10-CM | POA: Diagnosis not present

## 2023-08-05 DIAGNOSIS — Z3A33 33 weeks gestation of pregnancy: Secondary | ICD-10-CM | POA: Diagnosis not present

## 2023-08-05 DIAGNOSIS — O34219 Maternal care for unspecified type scar from previous cesarean delivery: Secondary | ICD-10-CM

## 2023-08-05 NOTE — Progress Notes (Signed)
   PRENATAL VISIT NOTE  Subjective:  Deanna May is a 33 y.o. [id number] at [redacted]w[redacted]d being seen today for ongoing prenatal care.  She is currently monitored for the following issues for this high-risk pregnancy and has history of cesarean x 5; Supervision of high risk pregnancy, antepartum; Echogenic intracardiac focus of fetus on prenatal ultrasound; Pap smear abnormality of cervix/human papillomavirus (HPV) positive; Polyhydramnios affecting pregnancy; Grand multipara in labor, third trimester; and Anemia affecting pregnancy in third trimester on their problem list.  Patient reports  reports intermittent pressure  .  Contractions: Irregular. Vag. Bleeding: None.  Movement: Present. Denies leaking of fluid.   The following portions of the patient's history were reviewed and updated as appropriate: allergies, current medications, past family history, past medical history, past social history, past surgical history and problem list.   Objective:   Vitals:   08/05/23 1011  BP: (!) 96/53  Pulse: 78  Weight: 195 lb (88.5 kg)    Fetal Status: Fetal Heart Rate (bpm): 136   Movement: Present     General:  Alert, oriented and cooperative. Patient is in no acute distress.  Skin: Skin is warm and dry. No rash noted.   Cardiovascular: Normal heart rate noted  Respiratory: Normal respiratory effort, no problems with respiration noted  Abdomen: Soft, gravid, appropriate for gestational age.  Pain/Pressure: Present     Pelvic: Cervical exam deferred        Extremities: Normal range of motion.  Edema: None  Mental Status: Normal mood and affect. Normal behavior. Normal judgment and thought content.   Assessment and Plan:  Pregnancy: [id number] at [redacted]w[redacted]d 1. Supervision of high risk pregnancy, antepartum (Primary) BP and FHR normal Doing well, feeling regular movement    2. history of cesarean x 5 RCS scheduled 4/11 Follow up u/s 3/14  3. [redacted] weeks gestation of pregnancy Still considering  pediatrician Discussed 36 week swabs next visit  Supportive measures discussed regarding pressure and when to follow up   4. Anemia affecting pregnancy in third trimester S/p iv iron   Preterm labor symptoms and general obstetric precautions including but not limited to vaginal bleeding, contractions, leaking of fluid and fetal movement were reviewed in detail with the patient. Please refer to After Visit Summary for other counseling recommendations.   No follow-ups on file.  Future Appointments  Date Time Provider Department Center  08/13/2023 11:15 AM [facility] NURSE [facility]  08/13/2023 11:30 AM [facility] US4 [facility]  08/17/2023  8:30 AM [facility] None  08/19/2023  9:55 AM Raelyn Mora, CNM [facility] None  08/26/2023  8:55 AM Gerrit Heck, CNM [facility] None  09/02/2023  9:55 AM Carlynn Herald, CNM [facility] None  09/09/2023  9:55 AM Ralene Muskrat, PA-C [facility] None  09/16/2023  9:55 AM Sue Lush, FNP [facility] None  09/23/2023  9:55 AM Sue Lush, FNP [facility] None    Albertine Grates, FNP

## 2023-08-05 NOTE — Progress Notes (Signed)
 Some pressure in vaginal area but no contractions.

## 2023-08-13 ENCOUNTER — Ambulatory Visit: Payer: Medicaid Other | Admitting: *Deleted

## 2023-08-13 ENCOUNTER — Ambulatory Visit: Payer: Medicaid Other | Attending: Obstetrics

## 2023-08-13 VITALS — BP 115/58 | HR 76

## 2023-08-13 DIAGNOSIS — O409XX0 Polyhydramnios, unspecified trimester, not applicable or unspecified: Secondary | ICD-10-CM | POA: Diagnosis present

## 2023-08-13 DIAGNOSIS — O0990 Supervision of high risk pregnancy, unspecified, unspecified trimester: Secondary | ICD-10-CM | POA: Insufficient documentation

## 2023-08-13 DIAGNOSIS — Z3A34 34 weeks gestation of pregnancy: Secondary | ICD-10-CM | POA: Diagnosis not present

## 2023-08-13 DIAGNOSIS — O09899 Supervision of other high risk pregnancies, unspecified trimester: Secondary | ICD-10-CM | POA: Diagnosis present

## 2023-08-13 DIAGNOSIS — O403XX0 Polyhydramnios, third trimester, not applicable or unspecified: Secondary | ICD-10-CM

## 2023-08-13 DIAGNOSIS — O0943 Supervision of pregnancy with grand multiparity, third trimester: Secondary | ICD-10-CM

## 2023-08-13 DIAGNOSIS — O34219 Maternal care for unspecified type scar from previous cesarean delivery: Secondary | ICD-10-CM | POA: Diagnosis not present

## 2023-08-16 ENCOUNTER — Inpatient Hospital Stay (HOSPITAL_COMMUNITY)
Admission: AD | Admit: 2023-08-16 | Discharge: 2023-08-17 | Disposition: A | Discharge status: Home / Self Care | Attending: Obstetrics & Gynecology | Admitting: Obstetrics & Gynecology

## 2023-08-16 ENCOUNTER — Encounter (HOSPITAL_COMMUNITY): Payer: Self-pay | Admitting: Obstetrics & Gynecology

## 2023-08-16 DIAGNOSIS — O4700 False labor before 37 completed weeks of gestation, unspecified trimester: Secondary | ICD-10-CM

## 2023-08-16 DIAGNOSIS — O34219 Maternal care for unspecified type scar from previous cesarean delivery: Secondary | ICD-10-CM | POA: Insufficient documentation

## 2023-08-16 DIAGNOSIS — Z3A35 35 weeks gestation of pregnancy: Secondary | ICD-10-CM | POA: Insufficient documentation

## 2023-08-16 DIAGNOSIS — O0990 Supervision of high risk pregnancy, unspecified, unspecified trimester: Secondary | ICD-10-CM

## 2023-08-16 DIAGNOSIS — O4703 False labor before 37 completed weeks of gestation, third trimester: Secondary | ICD-10-CM | POA: Diagnosis present

## 2023-08-16 LAB — URINALYSIS, ROUTINE W REFLEX MICROSCOPIC
Bilirubin Urine: NEGATIVE
Glucose, UA: NEGATIVE mg/dL
Hgb urine dipstick: NEGATIVE
Ketones, ur: NEGATIVE mg/dL
Leukocytes,Ua: NEGATIVE
Nitrite: NEGATIVE
Protein, ur: NEGATIVE mg/dL
Specific Gravity, Urine: 1.003 — ABNORMAL LOW (ref 1.005–1.030)
pH: 7 (ref 5.0–8.0)

## 2023-08-16 MED ORDER — NIFEDIPINE 10 MG PO CAPS
10.0000 mg | ORAL_CAPSULE | ORAL | Status: DC | PRN
  Administered 2023-08-16 (×3): 10 mg via ORAL
  Filled 2023-08-16 (×4): qty 1

## 2023-08-16 NOTE — MAU Provider Note (Signed)
 Chief Complaint:  Contractions   Event Date/Time   First Provider Initiated Contact with Patient 08/16/23 2220     HPI: Deanna May is a 33 y.o. [id number] at 76w3dwho presents to maternity admissions reporting contractions since 6pm.  History is remarkable for C/S x 5.  Repeat C/S is scheduled for 09/10/23. She reports good fetal movement, denies LOF, vaginal bleeding, urinary symptoms, n/v, or fever/chills.    Abdominal Pain The current episode started today. The onset quality is gradual. The problem occurs intermittently. The quality of the pain is cramping. The abdominal pain does not radiate. Nothing aggravates the pain. The pain is relieved by Nothing. She has tried nothing for the symptoms.   RN Note:    Pt says she feels UC's - started at 6 pm 8/10 PNC- Deanna May Last sex- 2 mths Sch C/S   -09-10-2023      Past Medical History: Past Medical History:  Diagnosis Date   Depression 2018   Headache     Past obstetric history: OB History  Gravida Para Term Preterm AB Living  7 5 5  1 4   SAB IAB Ectopic Multiple Live Births   1  0 5    # Outcome Date GA Lbr Len/2nd Weight Sex Type Anes PTL Lv  7 Current           6 Term 08/14/22 [redacted]w[redacted]d  3570 g F CS-LTranv Spinal, EPI  DEC  5 IAB 2018 [redacted]w[redacted]d         4 Term 09/23/15 [redacted]w[redacted]d    CS-Unspec   LIV  3 Term 09/16/12     CS-Unspec   LIV  2 Term 10/30/09 [redacted]w[redacted]d    CS-Unspec   LIV  1 Term 10/16/07 [redacted]w[redacted]d    CS-Unspec   LIV    Past Surgical History: Past Surgical History:  Procedure Laterality Date   CESAREAN SECTION  2009, 2011, 2014, 2017   CESAREAN SECTION WITH BILATERAL TUBAL LIGATION N/A 08/14/2022   Procedure was C/S only, NO BTL Surgeon: Deanna Plum, MD    Family History: Family History  Problem Relation Age of Onset   Asthma Sister    Allergies Sister    Autism spectrum disorder Son    Autism spectrum disorder Son     Social History: Social History   Tobacco Use   Smoking status: Never   Smokeless tobacco:  Never  Vaping Use   Vaping status: Never Used  Substance Use Topics   Alcohol use: Not Currently   Drug use: Not Currently    Types: Marijuana    Allergies: No Known Allergies  Meds:  Medications Prior to Admission  Medication Sig Dispense Refill Last Dose/Taking   ferrous sulfate 325 (65 FE) MG tablet Take 1 tablet (325 mg total) by mouth every other day. 90 tablet 1    ondansetron (ZOFRAN-ODT) 4 MG disintegrating tablet Take 1 tablet (4 mg total) by mouth every 8 (eight) hours as needed for nausea or vomiting. (Patient not taking: Reported on 07/08/2023) 20 tablet 0    Prenatal Vit-Fe Fumarate-FA (PRENATAL VITAMIN PLUS LOW IRON) 27-1 MG TABS Take 1 tablet by mouth daily. 30 tablet 12     I have reviewed patient's Past Medical Hx, Surgical Hx, Family Hx, Social Hx, medications and allergies.   ROS:  Review of Systems  Gastrointestinal:  Positive for abdominal pain.   Other systems negative  Physical Exam  Patient Vitals for the past 24 hrs:  BP Temp Temp src Pulse Resp Height  Weight  08/16/23 2210 (!) 113/58 98.7 F (37.1 C) Oral 74 14 5\' 9"  (1.753 m) 88.4 kg   Constitutional: Well-developed, well-nourished female in no acute distress.  Cardiovascular: normal rate  Respiratory: normal effort GI: Abd soft, non-tender, gravid appropriate for gestational age.   No rebound or guarding. MS: Extremities nontender, no edema, normal ROM Neurologic: Alert and oriented x 4. .  PELVIC EXAM:   Dilation: Closed Effacement (%): Thick Cervical Position: Posterior Exam by:: Deanna May,CNM  FHT:  Baseline 140 , moderate variability, accelerations present, no decelerations Contractions:   Irregular  every 8-58min   Labs: Results for orders placed or performed during the hospital encounter of 08/16/23 (from the past 24 hours)  Urinalysis, Routine w reflex microscopic -Urine, Clean Catch     Status: Abnormal   Collection Time: 08/16/23 10:32 PM  Result Value Ref Range   Color, Urine  STRAW (A) YELLOW   APPearance CLEAR CLEAR   Specific Gravity, Urine 1.003 (L) 1.005 - 1.030   pH 7.0 5.0 - 8.0   Glucose, UA NEGATIVE NEGATIVE mg/dL   Hgb urine dipstick NEGATIVE NEGATIVE   Bilirubin Urine NEGATIVE NEGATIVE   Ketones, ur NEGATIVE NEGATIVE mg/dL   Protein, ur NEGATIVE NEGATIVE mg/dL   Nitrite NEGATIVE NEGATIVE   Leukocytes,Ua NEGATIVE NEGATIVE    A/Positive/-- (09/26 0940)  Imaging:    MAU Course/MDM: I have reviewed the triage vital signs and the nursing notes.   Pertinent labs & imaging results that were available during my care of the patient were reviewed by me and considered in my medical decision making (see chart for details).      I have reviewed her medical records including past results, notes and treatments.   I have ordered labs and reviewed results.  NST reviewed    Consult Dr Deanna May with presentation, exam findings and test results.  Treatments in MAU included Procardia series then one dose of Terbutaline.  UCs spaced out and got less intense after treatments. .    Assessment: Single IUP at [redacted]w[redacted]d Threatened preterm labor Prior C/S x 5  Plan: Discharge home Labor precautions and fetal kick counts Follow up in Office for prenatal visits and recheck Encouraged to return if she develops worsening of symptoms, increase in pain, fever, or other concerning symptoms.   Pt stable at time of discharge.  Deanna May CNM, MSN Certified Nurse-Midwife 08/16/2023 10:20 PM

## 2023-08-16 NOTE — MAU Note (Addendum)
 Pt says she feels UC's - started at 6 pm 8/10 PNC- [name] Last sex- 2 mths Sch C/S   -09-10-2023

## 2023-08-17 ENCOUNTER — Ambulatory Visit: Payer: Medicaid Other

## 2023-08-17 VITALS — BP 105/68 | HR 75 | Temp 98.4°F | Resp 16 | Ht 69.0 in | Wt 195.2 lb

## 2023-08-17 DIAGNOSIS — D508 Other iron deficiency anemias: Secondary | ICD-10-CM

## 2023-08-17 DIAGNOSIS — O99013 Anemia complicating pregnancy, third trimester: Secondary | ICD-10-CM

## 2023-08-17 DIAGNOSIS — Z3A35 35 weeks gestation of pregnancy: Secondary | ICD-10-CM | POA: Diagnosis not present

## 2023-08-17 DIAGNOSIS — O4703 False labor before 37 completed weeks of gestation, third trimester: Secondary | ICD-10-CM

## 2023-08-17 DIAGNOSIS — O34219 Maternal care for unspecified type scar from previous cesarean delivery: Secondary | ICD-10-CM

## 2023-08-17 MED ORDER — IRON SUCROSE 20 MG/ML IV SOLN
500.0000 mg | Freq: Once | INTRAVENOUS | Status: AC
  Administered 2023-08-17: 500 mg via INTRAVENOUS
  Filled 2023-08-17: qty 25

## 2023-08-17 MED ORDER — TERBUTALINE SULFATE 1 MG/ML IJ SOLN
0.2500 mg | Freq: Once | INTRAMUSCULAR | Status: AC
  Administered 2023-08-17: 0.25 mg via SUBCUTANEOUS
  Filled 2023-08-17: qty 1

## 2023-08-17 MED ORDER — ACETAMINOPHEN 325 MG PO TABS
650.0000 mg | ORAL_TABLET | Freq: Once | ORAL | Status: AC
  Administered 2023-08-17: 650 mg via ORAL
  Filled 2023-08-17: qty 2

## 2023-08-17 MED ORDER — IRON SUCROSE 500 MG IVPB - SIMPLE MED: 500.0000 mg | Freq: Once | INTRAVENOUS | Status: DC

## 2023-08-17 MED ORDER — DIPHENHYDRAMINE HCL 25 MG PO CAPS
25.0000 mg | ORAL_CAPSULE | Freq: Once | ORAL | Status: AC
  Administered 2023-08-17: 25 mg via ORAL
  Filled 2023-08-17: qty 1

## 2023-08-17 NOTE — Progress Notes (Signed)
 Diagnosis: Iron Deficiency Anemia  Provider:  Chilton Greathouse MD  Procedure: IV Infusion  IV Type: Peripheral, IV Location: L Antecubital  Venofer (Iron Sucrose), Dose: 500 mg  Infusion Start Time: 0915  Infusion Stop Time: 1330  Post Infusion IV Care: Observation period completed and Peripheral IV Discontinued  Discharge: Condition: Good, Destination: Home . AVS Declined  Performed by:  Adriana Mccallum, RN

## 2023-08-19 ENCOUNTER — Other Ambulatory Visit (HOSPITAL_COMMUNITY)
Admission: RE | Admit: 2023-08-19 | Discharge: 2023-08-19 | Disposition: A | Discharge status: Home / Self Care | Source: Ambulatory Visit | Attending: Obstetrics and Gynecology | Admitting: Obstetrics and Gynecology

## 2023-08-19 ENCOUNTER — Ambulatory Visit: Admitting: Obstetrics and Gynecology

## 2023-08-19 VITALS — BP 119/75 | HR 70 | Wt 197.0 lb

## 2023-08-19 DIAGNOSIS — O34219 Maternal care for unspecified type scar from previous cesarean delivery: Secondary | ICD-10-CM

## 2023-08-19 DIAGNOSIS — Z3A35 35 weeks gestation of pregnancy: Secondary | ICD-10-CM | POA: Diagnosis not present

## 2023-08-19 DIAGNOSIS — O0990 Supervision of high risk pregnancy, unspecified, unspecified trimester: Secondary | ICD-10-CM | POA: Insufficient documentation

## 2023-08-19 NOTE — Patient Instructions (Signed)

## 2023-08-20 LAB — CERVICOVAGINAL ANCILLARY ONLY
Bacterial Vaginitis (gardnerella): NEGATIVE
Candida Glabrata: NEGATIVE
Candida Vaginitis: NEGATIVE
Chlamydia: NEGATIVE
Comment: NEGATIVE
Comment: NEGATIVE
Comment: NEGATIVE
Comment: NEGATIVE
Comment: NEGATIVE
Comment: NORMAL
Neisseria Gonorrhea: NEGATIVE
Trichomonas: NEGATIVE

## 2023-08-20 NOTE — Progress Notes (Signed)
  HIGH-RISK PREGNANCY OFFICE VISIT Patient name: Deanna May MRN [id number]  Date of birth: Apr 28, 1991 Chief Complaint:   Routine Prenatal Visit  History of Present Illness:   Deanna May is a 33 y.o. [id number] female at [redacted]w[redacted]d with an Estimated Date of Delivery: 09/17/23 being seen today for ongoing management of a high-risk pregnancy complicated by  previous C/S x 5, anemia and h/o neonatal death (27-Aug-2022). Today she reports no complaints. Contractions: Irregular. Vag. Bleeding: None.  Movement: Present. denies leaking of fluid.  Review of Systems:   Pertinent items are noted in HPI Denies abnormal vaginal discharge w/ itching/odor/irritation, headaches, visual changes, shortness of breath, chest pain, abdominal pain, severe nausea/vomiting, or problems with urination or bowel movements unless otherwise stated above. Pertinent History Reviewed:  Reviewed past medical,surgical, social, obstetrical and family history.  Reviewed problem list, medications and allergies. Physical Assessment:   Vitals:   08/19/23 1001  BP: 119/75  Pulse: 70  Weight: 197 lb (89.4 kg)  Body mass index is 29.09 kg/m.           Physical Examination:   General appearance: alert, well appearing, and in no distress, oriented to person, place, and time, and normal appearing weight  Mental status: alert, oriented to person, place, and time, normal mood, behavior, speech, dress, motor activity, and thought processes, affect appropriate to mood  Skin: warm & dry   Extremities: Edema: None    Cardiovascular: normal heart rate noted  Respiratory: normal respiratory effort, no distress  Abdomen: gravid, soft, non-tender  Pelvic: Cervical exam deferred         Fetal Status: Fetal Heart Rate (bpm): 140 Fundal Height: 36 cm Movement: Present    Fetal Surveillance Testing today: none   No results found for this or any previous visit (from the past 24 hours).  Assessment & Plan:  1) High-risk pregnancy [id number] at  [redacted]w[redacted]d with an Estimated Date of Delivery: 09/17/23   2) Supervision of high risk pregnancy, antepartum (Primary) - Strep Gp B NAA - Cervicovaginal ancillary only( [place])  3) History of Cesarean x 5 - Planned C/S on 09/10/2023  4) [redacted] weeks gestation of pregnancy   Meds: No orders of the defined types were placed in this encounter.   Labs/procedures today: none  Treatment Plan: continue current regimen  Reviewed: Preterm labor symptoms and general obstetric precautions including but not limited to vaginal bleeding, contractions, leaking of fluid and fetal movement were reviewed in detail with the patient.  All questions were answered. Has home bp cuff. Check bp weekly, let us know if >140/90.   Follow-up: Return in 1 week (on 08/26/2023) for Return OB visit.  Orders Placed This Encounter  Procedures   Strep Gp B NAA   Raelyn Mora MSN, CNM 08/19/2023 10:00 AM

## 2023-08-21 LAB — STREP GP B NAA: Strep Gp B NAA: POSITIVE — AB

## 2023-08-23 ENCOUNTER — Encounter: Payer: Self-pay | Admitting: Obstetrics and Gynecology

## 2023-08-25 ENCOUNTER — Other Ambulatory Visit: Payer: Self-pay | Admitting: Obstetrics and Gynecology

## 2023-08-26 ENCOUNTER — Encounter: Admitting: Obstetrics and Gynecology

## 2023-08-26 ENCOUNTER — Encounter

## 2023-08-31 ENCOUNTER — Encounter (HOSPITAL_COMMUNITY): Payer: Self-pay

## 2023-08-31 NOTE — Patient Instructions (Signed)
 Saidi Santacroce  08/31/2023   Your procedure is scheduled on:  09/10/2023  Arrive at 0730 at Entrance C on [address] at [facility]  and [employer]. You are invited to use the FREE valet parking or use the Visitor's parking deck.  Pick up the phone at the desk and dial [phone].  Call this number if you have problems the morning of surgery: [phone]  Remember:   Do not eat food:(After Midnight) Desps de medianoche.  You may drink clear liquids until arrival at ___0730__.  Clear liquids means a liquid you can see thru.  It can have color such as Cola or Kool aid.  Tea is OK and coffee as long as no milk or creamer of any kind.  Take these medicines the morning of surgery with A SIP OF WATER:  none   Do not wear jewelry, make-up or nail polish.  Do not wear lotions, powders, or perfumes. Do not wear deodorant.  Do not shave 48 hours prior to surgery.  Do not bring valuables to the hospital.  [facility] is not   responsible for any belongings or valuables brought to the hospital.  Contacts, dentures or bridgework may not be worn into surgery.  Leave suitcase in the car. After surgery it may be brought to your room.  For patients admitted to the hospital, checkout time is 11:00 AM the day of              discharge.      Please read over the following fact sheets that you were given:     Preparing for Surgery

## 2023-09-02 ENCOUNTER — Encounter: Admitting: Certified Nurse Midwife

## 2023-09-02 ENCOUNTER — Ambulatory Visit (INDEPENDENT_AMBULATORY_CARE_PROVIDER_SITE_OTHER): Admitting: Obstetrics and Gynecology

## 2023-09-02 ENCOUNTER — Other Ambulatory Visit (HOSPITAL_COMMUNITY)
Admission: RE | Admit: 2023-09-02 | Discharge: 2023-09-02 | Disposition: A | Discharge status: Home / Self Care | Source: Ambulatory Visit | Attending: Obstetrics and Gynecology | Admitting: Obstetrics and Gynecology

## 2023-09-02 VITALS — BP 101/64 | HR 76 | Wt 197.0 lb

## 2023-09-02 DIAGNOSIS — O0990 Supervision of high risk pregnancy, unspecified, unspecified trimester: Secondary | ICD-10-CM

## 2023-09-02 DIAGNOSIS — Z3A37 37 weeks gestation of pregnancy: Secondary | ICD-10-CM | POA: Diagnosis not present

## 2023-09-02 DIAGNOSIS — N898 Other specified noninflammatory disorders of vagina: Secondary | ICD-10-CM | POA: Insufficient documentation

## 2023-09-02 DIAGNOSIS — O34219 Maternal care for unspecified type scar from previous cesarean delivery: Secondary | ICD-10-CM

## 2023-09-02 DIAGNOSIS — O99013 Anemia complicating pregnancy, third trimester: Secondary | ICD-10-CM

## 2023-09-02 NOTE — Progress Notes (Signed)
 Pt complains of vaginal itching x 1 week - will do self swab today.

## 2023-09-02 NOTE — Progress Notes (Signed)
   PRENATAL VISIT NOTE  Subjective:  Deanna May is a 33 y.o. [id number] at [redacted]w[redacted]d being seen today for ongoing prenatal care.  She is currently monitored for the following issues for this high-risk pregnancy and has history of cesarean x 5; Supervision of high risk pregnancy, antepartum; Echogenic intracardiac focus of fetus on prenatal ultrasound; Pap smear abnormality of cervix/human papillomavirus (HPV) positive; Polyhydramnios affecting pregnancy; Grand multipara in labor, third trimester; and Anemia affecting pregnancy in third trimester on their problem list.  Patient doing well with no acute concerns today. She reports  vaginal itching .  Contractions: Irregular. Vag. Bleeding: None.  Movement: Present. Denies leaking of fluid.   The following portions of the patient's history were reviewed and updated as appropriate: allergies, current medications, past family history, past medical history, past social history, past surgical history and problem list. Problem list updated.  Objective:   Vitals:   09/02/23 1038  BP: 101/64  Pulse: 76  Weight: 197 lb (89.4 kg)    Fetal Status: Fetal Heart Rate (bpm): 150 Fundal Height: 40 cm Movement: Present  Presentation: Vertex  General:  Alert, oriented and cooperative. Patient is in no acute distress.  Skin: Skin is warm and dry. No rash noted.   Cardiovascular: Normal heart rate noted  Respiratory: Normal respiratory effort, no problems with respiration noted  Abdomen: Soft, gravid, appropriate for gestational age.  Pain/Pressure: Present     Pelvic: Cervical exam deferred        Extremities: Normal range of motion.     Mental Status:  Normal mood and affect. Normal behavior. Normal judgment and thought content.  Vtx on bedside ultrasound Assessment and Plan:  Pregnancy: [id number] at [redacted]w[redacted]d  1. [redacted] weeks gestation of pregnancy (Primary)   2. Supervision of high risk pregnancy, antepartum Continue routine prenatal care  3. history of  cesarean x 5 Repeat c/s scheduled for 09/10/23 Pt desires interval IUD  4. Anemia affecting pregnancy in third trimester   5. Vaginal itching Self swab taking, results pending - Cervicovaginal ancillary only( [place])  Term labor symptoms and general obstetric precautions including but not limited to vaginal bleeding, contractions, leaking of fluid and fetal movement were reviewed in detail with the patient.  Please refer to After Visit Summary for other counseling recommendations.   Return in about 1 week (around 09/09/2023) for in person, [facility].   Mariel Aloe, MD Faculty Attending Center for [facility]

## 2023-09-03 ENCOUNTER — Encounter: Payer: Self-pay | Admitting: Obstetrics and Gynecology

## 2023-09-03 LAB — CERVICOVAGINAL ANCILLARY ONLY
Bacterial Vaginitis (gardnerella): NEGATIVE
Candida Glabrata: NEGATIVE
Candida Vaginitis: NEGATIVE
Comment: NEGATIVE
Comment: NEGATIVE
Comment: NEGATIVE
Comment: NEGATIVE
Trichomonas: NEGATIVE

## 2023-09-07 NOTE — Anesthesia Preprocedure Evaluation (Addendum)
 Anesthesia Evaluation  Patient identified by MRN, date of birth, ID band Patient awake    Reviewed: Allergy & Precautions, NPO status , Patient's Chart, lab work & pertinent test results  History of Anesthesia Complications (+) PONV and history of anesthetic complications  Airway Mallampati: II  TM Distance: >3 FB Neck ROM: Full    Dental no notable dental hx.    Pulmonary neg pulmonary ROS   Pulmonary exam normal breath sounds clear to auscultation       Cardiovascular negative cardio ROS Normal cardiovascular exam Rhythm:Regular Rate:Normal     Neuro/Psych  Headaches PSYCHIATRIC DISORDERS  Depression       GI/Hepatic negative GI ROS, Neg liver ROS,,,  Endo/Other  negative endocrine ROS    Renal/GU negative Renal ROS  negative genitourinary   Musculoskeletal negative musculoskeletal ROS (+)    Abdominal   Peds  Hematology Hb 12, plt 307   Anesthesia Other Findings   Reproductive/Obstetrics (+) Pregnancy Repeat c section (5 priors)- 2009, 2011, 2014, 2017, 2024                             Anesthesia Physical Anesthesia Plan  ASA: 2  Anesthesia Plan: Combined Spinal and Epidural   Post-op Pain Management: Regional block, Ofirmev IV (intra-op)* and Toradol IV (intra-op)   Induction:   PONV Risk Score and Plan: 3 and Ondansetron, Treatment may vary due to age or medical condition and Dexamethasone  Airway Management Planned: Natural Airway and Nasal Cannula  Additional Equipment: None  Intra-op Plan:   Post-operative Plan:   Informed Consent: I have reviewed the patients History and Physical, chart, labs and discussed the procedure including the risks, benefits and alternatives for the proposed anesthesia with the patient or authorized representative who has indicated his/her understanding and acceptance.       Plan Discussed with: CRNA  Anesthesia Plan Comments:         Anesthesia Quick Evaluation

## 2023-09-08 ENCOUNTER — Encounter (HOSPITAL_COMMUNITY)
Admission: RE | Admit: 2023-09-08 | Discharge: 2023-09-08 | Disposition: A | Discharge status: Home / Self Care | Source: Ambulatory Visit | Attending: Obstetrics and Gynecology | Admitting: Obstetrics and Gynecology

## 2023-09-08 DIAGNOSIS — Z01812 Encounter for preprocedural laboratory examination: Secondary | ICD-10-CM | POA: Diagnosis present

## 2023-09-08 HISTORY — DX: Nausea with vomiting, unspecified: Z98.890

## 2023-09-08 LAB — CBC
HCT: 36.7 % (ref 36.0–46.0)
Hemoglobin: 12 g/dL (ref 12.0–15.0)
MCH: 27.2 pg (ref 26.0–34.0)
MCHC: 32.7 g/dL (ref 30.0–36.0)
MCV: 83.2 fL (ref 80.0–100.0)
Platelets: 307 10*3/uL (ref 150–400)
RBC: 4.41 MIL/uL (ref 3.87–5.11)
RDW: 21 % — ABNORMAL HIGH (ref 11.5–15.5)
WBC: 9.3 10*3/uL (ref 4.0–10.5)
nRBC: 0 % (ref 0.0–0.2)

## 2023-09-08 LAB — TYPE AND SCREEN
ABO/RH(D): A POS
Antibody Screen: NEGATIVE

## 2023-09-08 LAB — RPR: RPR Ser Ql: NONREACTIVE

## 2023-09-09 ENCOUNTER — Ambulatory Visit (INDEPENDENT_AMBULATORY_CARE_PROVIDER_SITE_OTHER): Admitting: Obstetrics and Gynecology

## 2023-09-09 ENCOUNTER — Encounter: Admitting: Physician Assistant

## 2023-09-09 VITALS — BP 95/69 | HR 74 | Wt 198.0 lb

## 2023-09-09 DIAGNOSIS — O0990 Supervision of high risk pregnancy, unspecified, unspecified trimester: Secondary | ICD-10-CM

## 2023-09-09 DIAGNOSIS — O99013 Anemia complicating pregnancy, third trimester: Secondary | ICD-10-CM | POA: Diagnosis not present

## 2023-09-09 DIAGNOSIS — O34219 Maternal care for unspecified type scar from previous cesarean delivery: Secondary | ICD-10-CM

## 2023-09-09 NOTE — Patient Instructions (Signed)
 Good luck tomorrow and congratulations!

## 2023-09-09 NOTE — Progress Notes (Signed)
   PRENATAL VISIT NOTE  Subjective:  Deanna May is a 33 y.o. [id number] at [redacted]w[redacted]d being seen today for ongoing prenatal care.  She is currently monitored for the following issues for this high-risk pregnancy and has history of cesarean x 5; Supervision of high risk pregnancy, antepartum; Echogenic intracardiac focus of fetus on prenatal ultrasound; Pap smear abnormality of cervix/human papillomavirus (HPV) positive; Polyhydramnios affecting pregnancy; Grand multipara in labor, third trimester; and Anemia affecting pregnancy in third trimester on their problem list.  Patient reports no complaints.  Contractions: Irregular. Vag. Bleeding: None.  Movement: Present. Denies leaking of fluid.   C-section scheduled for tomorrow. Completed preadmission tests.  The following portions of the patient's history were reviewed and updated as appropriate: allergies, current medications, past family history, past medical history, past social history, past surgical history and problem list.   Objective:   Vitals:   09/09/23 1002  BP: 95/69  Pulse: 74  Weight: 198 lb (89.8 kg)   Body mass index is 29.24 kg/m. Total weight gain: 36 lb (16.3 kg)   Fetal Status: Fetal Heart Rate (bpm): 130 Fundal Height: 39 cm Movement: Present     General:  Alert, oriented and cooperative. Patient is in no acute distress.  Skin: Skin is warm and dry. No rash noted.   Cardiovascular: Normal heart rate noted  Respiratory: Normal respiratory effort, no problems with respiration noted  Abdomen: Soft, gravid, appropriate for gestational age.  Pain/Pressure: Present     Pelvic: Cervical exam deferred        Extremities: Normal range of motion.     Mental Status: Normal mood and affect. Normal behavior. Normal judgment and thought content.   Assessment and Plan:  Pregnancy: [id number] at [redacted]w[redacted]d 1. Supervision of high risk pregnancy, antepartum (Primary) Last growth 3/14 at 34 wks- EFW 89%, AC 97% Normal AFI-- Poly  resolved C-section scheduled for tomorrow, preadmission labs appropriate  2. History of cesarean delivery affecting pregnancy See above  3. Anemia affecting pregnancy in third trimester Labs appropriate yesterday.  Term labor symptoms and general obstetric precautions including but not limited to vaginal bleeding, contractions, leaking of fluid and fetal movement were reviewed in detail with the patient. Please refer to After Visit Summary for other counseling recommendations.   No follow-ups on file.  Future Appointments  Date Time Provider Department Center  09/16/2023  9:55 AM Shea Evans, Gillian Scarce, MD [facility] None  09/23/2023  9:55 AM Constant, Gigi Gin, MD [facility] None    Wanita Chamberlain, MD

## 2023-09-10 ENCOUNTER — Inpatient Hospital Stay (HOSPITAL_COMMUNITY): Payer: Self-pay | Admitting: Anesthesiology

## 2023-09-10 ENCOUNTER — Inpatient Hospital Stay (HOSPITAL_COMMUNITY)
Admission: RE | Admit: 2023-09-10 | Discharge: 2023-09-13 | DRG: 787 | Disposition: A | Discharge status: Home / Self Care | Payer: Medicaid Other | Attending: Obstetrics and Gynecology | Admitting: Obstetrics and Gynecology

## 2023-09-10 ENCOUNTER — Encounter (HOSPITAL_COMMUNITY)
Admission: RE | Disposition: A | Discharge status: Home / Self Care | Payer: Self-pay | Source: Home / Self Care | Attending: Obstetrics and Gynecology

## 2023-09-10 ENCOUNTER — Other Ambulatory Visit: Payer: Self-pay

## 2023-09-10 ENCOUNTER — Encounter (HOSPITAL_COMMUNITY): Payer: Self-pay | Admitting: Obstetrics and Gynecology

## 2023-09-10 DIAGNOSIS — O0990 Supervision of high risk pregnancy, unspecified, unspecified trimester: Secondary | ICD-10-CM

## 2023-09-10 DIAGNOSIS — O9081 Anemia of the puerperium: Secondary | ICD-10-CM | POA: Diagnosis not present

## 2023-09-10 DIAGNOSIS — O403XX0 Polyhydramnios, third trimester, not applicable or unspecified: Secondary | ICD-10-CM | POA: Diagnosis present

## 2023-09-10 DIAGNOSIS — D62 Acute posthemorrhagic anemia: Secondary | ICD-10-CM | POA: Diagnosis not present

## 2023-09-10 DIAGNOSIS — O34219 Maternal care for unspecified type scar from previous cesarean delivery: Secondary | ICD-10-CM | POA: Diagnosis present

## 2023-09-10 DIAGNOSIS — O34211 Maternal care for low transverse scar from previous cesarean delivery: Secondary | ICD-10-CM | POA: Diagnosis present

## 2023-09-10 DIAGNOSIS — K219 Gastro-esophageal reflux disease without esophagitis: Secondary | ICD-10-CM | POA: Diagnosis present

## 2023-09-10 DIAGNOSIS — Z3A39 39 weeks gestation of pregnancy: Secondary | ICD-10-CM

## 2023-09-10 DIAGNOSIS — O99824 Streptococcus B carrier state complicating childbirth: Secondary | ICD-10-CM | POA: Diagnosis present

## 2023-09-10 DIAGNOSIS — O9962 Diseases of the digestive system complicating childbirth: Secondary | ICD-10-CM | POA: Diagnosis present

## 2023-09-10 DIAGNOSIS — O409XX0 Polyhydramnios, unspecified trimester, not applicable or unspecified: Secondary | ICD-10-CM | POA: Diagnosis present

## 2023-09-10 DIAGNOSIS — O99820 Streptococcus B carrier state complicating pregnancy: Secondary | ICD-10-CM | POA: Diagnosis not present

## 2023-09-10 DIAGNOSIS — O283 Abnormal ultrasonic finding on antenatal screening of mother: Secondary | ICD-10-CM | POA: Diagnosis present

## 2023-09-10 DIAGNOSIS — R079 Chest pain, unspecified: Secondary | ICD-10-CM | POA: Diagnosis not present

## 2023-09-10 DIAGNOSIS — Z3490 Encounter for supervision of normal pregnancy, unspecified, unspecified trimester: Principal | ICD-10-CM

## 2023-09-10 DIAGNOSIS — O99013 Anemia complicating pregnancy, third trimester: Secondary | ICD-10-CM | POA: Diagnosis present

## 2023-09-10 LAB — CREATININE, SERUM
Creatinine, Ser: 0.8 mg/dL (ref 0.44–1.00)
GFR, Estimated: >60 mL/min (ref >60)

## 2023-09-10 LAB — CBC
HCT: 36.3 % (ref 36.0–46.0)
Hemoglobin: 12.1 g/dL (ref 12.0–15.0)
MCH: 27.7 pg (ref 26.0–34.0)
MCHC: 33.3 g/dL (ref 30.0–36.0)
MCV: 83.1 fL (ref 80.0–100.0)
Platelets: 251 10*3/uL (ref 150–400)
RBC: 4.37 MIL/uL (ref 3.87–5.11)
RDW: 20.8 % — ABNORMAL HIGH (ref 11.5–15.5)
WBC: 13.3 10*3/uL — ABNORMAL HIGH (ref 4.0–10.5)
nRBC: 0 % (ref 0.0–0.2)

## 2023-09-10 SURGERY — Surgical Case
Anesthesia: Regional

## 2023-09-10 MED ORDER — ENOXAPARIN SODIUM 40 MG/0.4ML IJ SOSY
40.0000 mg | PREFILLED_SYRINGE | INTRAMUSCULAR | Status: DC
  Administered 2023-09-11 – 2023-09-13 (×3): 40 mg via SUBCUTANEOUS
  Filled 2023-09-10 (×3): qty 0.4

## 2023-09-10 MED ORDER — PROMETHAZINE (PHENERGAN) 6.25MG IN NS 50ML IVPB
6.2500 mg | INTRAVENOUS | Status: DC | PRN
  Administered 2023-09-10: 6.25 mg via INTRAVENOUS
  Filled 2023-09-10: qty 6.25

## 2023-09-10 MED ORDER — DEXAMETHASONE SODIUM PHOSPHATE 10 MG/ML IJ SOLN
INTRAMUSCULAR | Status: DC | PRN
  Administered 2023-09-10: 10 mg via INTRAVENOUS

## 2023-09-10 MED ORDER — SCOPOLAMINE 1 MG/3DAYS TD PT72
1.0000 | MEDICATED_PATCH | Freq: Once | TRANSDERMAL | Status: AC
  Administered 2023-09-10: 1.5 mg via TRANSDERMAL

## 2023-09-10 MED ORDER — SIMETHICONE 80 MG PO CHEW
80.0000 mg | CHEWABLE_TABLET | Freq: Three times a day (TID) | ORAL | Status: DC
  Administered 2023-09-10 – 2023-09-13 (×4): 80 mg via ORAL
  Filled 2023-09-10 (×5): qty 1

## 2023-09-10 MED ORDER — ACETAMINOPHEN 500 MG PO TABS
1000.0000 mg | ORAL_TABLET | Freq: Four times a day (QID) | ORAL | Status: AC
  Administered 2023-09-10 – 2023-09-11 (×3): 1000 mg via ORAL
  Filled 2023-09-10 (×3): qty 2

## 2023-09-10 MED ORDER — KETOROLAC TROMETHAMINE 30 MG/ML IJ SOLN: 30.0000 mg | Freq: Four times a day (QID) | INTRAMUSCULAR | Status: AC | PRN

## 2023-09-10 MED ORDER — NALOXONE HCL 4 MG/10ML IJ SOLN: 1.0000 ug/kg/h | INTRAVENOUS | Status: DC | PRN

## 2023-09-10 MED ORDER — BUPIVACAINE IN DEXTROSE 0.75-8.25 % IT SOLN
INTRATHECAL | Status: DC | PRN
  Administered 2023-09-10: 1.5 mL via INTRATHECAL

## 2023-09-10 MED ORDER — ACETAMINOPHEN 10 MG/ML IV SOLN
INTRAVENOUS | Status: DC | PRN
  Administered 2023-09-10: 1000 mg via INTRAVENOUS

## 2023-09-10 MED ORDER — DIBUCAINE (PERIANAL) 1 % EX OINT: 1.0000 | TOPICAL_OINTMENT | CUTANEOUS | Status: DC | PRN

## 2023-09-10 MED ORDER — NALOXONE HCL 0.4 MG/ML IJ SOLN: 0.4000 mg | INTRAMUSCULAR | Status: DC | PRN

## 2023-09-10 MED ORDER — OXYTOCIN-SODIUM CHLORIDE 30-0.9 UT/500ML-% IV SOLN
INTRAVENOUS | Status: DC | PRN
  Administered 2023-09-10: 30 [IU] via INTRAVENOUS

## 2023-09-10 MED ORDER — ONDANSETRON HCL 4 MG/2ML IJ SOLN
INTRAMUSCULAR | Status: AC
  Filled 2023-09-10: qty 2

## 2023-09-10 MED ORDER — ONDANSETRON HCL 4 MG/2ML IJ SOLN: 4.0000 mg | Freq: Once | INTRAMUSCULAR | Status: DC | PRN

## 2023-09-10 MED ORDER — DEXAMETHASONE SODIUM PHOSPHATE 10 MG/ML IJ SOLN
INTRAMUSCULAR | Status: AC
  Filled 2023-09-10: qty 1

## 2023-09-10 MED ORDER — FENTANYL CITRATE (PF) 100 MCG/2ML IJ SOLN
INTRAMUSCULAR | Status: AC
  Filled 2023-09-10: qty 2

## 2023-09-10 MED ORDER — KETOROLAC TROMETHAMINE 30 MG/ML IJ SOLN
INTRAMUSCULAR | Status: AC
  Filled 2023-09-10: qty 1

## 2023-09-10 MED ORDER — WITCH HAZEL-GLYCERIN EX PADS: 1.0000 | MEDICATED_PAD | CUTANEOUS | Status: DC | PRN

## 2023-09-10 MED ORDER — SIMETHICONE 80 MG PO CHEW: 80.0000 mg | CHEWABLE_TABLET | ORAL | Status: DC | PRN

## 2023-09-10 MED ORDER — PHENYLEPHRINE HCL-NACL 20-0.9 MG/250ML-% IV SOLN
INTRAVENOUS | Status: DC | PRN
  Administered 2023-09-10: 60 ug/min via INTRAVENOUS

## 2023-09-10 MED ORDER — IBUPROFEN 600 MG PO TABS
600.0000 mg | ORAL_TABLET | Freq: Four times a day (QID) | ORAL | Status: DC
  Administered 2023-09-10 – 2023-09-13 (×11): 600 mg via ORAL
  Filled 2023-09-10 (×11): qty 1

## 2023-09-10 MED ORDER — FENTANYL CITRATE (PF) 100 MCG/2ML IJ SOLN
INTRAMUSCULAR | Status: DC | PRN
  Administered 2023-09-10: 15 ug via INTRATHECAL

## 2023-09-10 MED ORDER — LACTATED RINGERS IV SOLN: INTRAVENOUS | Status: AC

## 2023-09-10 MED ORDER — OXYTOCIN-SODIUM CHLORIDE 30-0.9 UT/500ML-% IV SOLN: 2.5000 [IU]/h | INTRAVENOUS | Status: AC

## 2023-09-10 MED ORDER — CEFAZOLIN SODIUM-DEXTROSE 2-4 GM/100ML-% IV SOLN
INTRAVENOUS | Status: AC
  Filled 2023-09-10: qty 100

## 2023-09-10 MED ORDER — SODIUM CHLORIDE 0.9% FLUSH: 3.0000 mL | INTRAVENOUS | Status: DC | PRN

## 2023-09-10 MED ORDER — SODIUM CHLORIDE 0.9 % IR SOLN
Status: DC | PRN
  Administered 2023-09-10: 1

## 2023-09-10 MED ORDER — POVIDONE-IODINE 10 % EX SWAB
2.0000 | Freq: Once | CUTANEOUS | Status: AC
  Administered 2023-09-10: 2 via TOPICAL

## 2023-09-10 MED ORDER — SCOPOLAMINE 1 MG/3DAYS TD PT72
MEDICATED_PATCH | TRANSDERMAL | Status: AC
  Filled 2023-09-10: qty 1

## 2023-09-10 MED ORDER — PHENYLEPHRINE 80 MCG/ML (10ML) SYRINGE FOR IV PUSH (FOR BLOOD PRESSURE SUPPORT)
PREFILLED_SYRINGE | INTRAVENOUS | Status: DC | PRN
Start: 2023-09-10 — End: 2023-09-10
  Administered 2023-09-10 (×2): 80 ug via INTRAVENOUS

## 2023-09-10 MED ORDER — KETOROLAC TROMETHAMINE 30 MG/ML IJ SOLN
30.0000 mg | Freq: Four times a day (QID) | INTRAMUSCULAR | Status: AC | PRN
  Administered 2023-09-10 (×2): 30 mg via INTRAVENOUS
  Filled 2023-09-10: qty 1

## 2023-09-10 MED ORDER — DIPHENHYDRAMINE HCL 25 MG PO CAPS: 25.0000 mg | ORAL_CAPSULE | Freq: Four times a day (QID) | ORAL | Status: DC | PRN

## 2023-09-10 MED ORDER — TRANEXAMIC ACID-NACL 1000-0.7 MG/100ML-% IV SOLN
INTRAVENOUS | Status: AC
  Filled 2023-09-10: qty 100

## 2023-09-10 MED ORDER — ACETAMINOPHEN 10 MG/ML IV SOLN
INTRAVENOUS | Status: AC
  Filled 2023-09-10: qty 100

## 2023-09-10 MED ORDER — STERILE WATER FOR IRRIGATION IR SOLN
Status: DC | PRN
  Administered 2023-09-10: 1

## 2023-09-10 MED ORDER — MORPHINE SULFATE (PF) 0.5 MG/ML IJ SOLN
INTRAMUSCULAR | Status: AC
  Filled 2023-09-10: qty 10

## 2023-09-10 MED ORDER — DIPHENHYDRAMINE HCL 50 MG/ML IJ SOLN: 12.5000 mg | INTRAMUSCULAR | Status: DC | PRN

## 2023-09-10 MED ORDER — ACETAMINOPHEN 500 MG PO TABS: 1000.0000 mg | ORAL_TABLET | Freq: Four times a day (QID) | ORAL | Status: DC

## 2023-09-10 MED ORDER — ONDANSETRON HCL 4 MG/2ML IJ SOLN: 4.0000 mg | Freq: Three times a day (TID) | INTRAMUSCULAR | Status: DC | PRN

## 2023-09-10 MED ORDER — OXYCODONE HCL 5 MG/5ML PO SOLN: 5.0000 mg | Freq: Once | ORAL | Status: DC | PRN

## 2023-09-10 MED ORDER — KETOROLAC TROMETHAMINE 30 MG/ML IJ SOLN: 30.0000 mg | Freq: Once | INTRAMUSCULAR | Status: DC | PRN

## 2023-09-10 MED ORDER — DIPHENHYDRAMINE HCL 25 MG PO CAPS: 25.0000 mg | ORAL_CAPSULE | ORAL | Status: DC | PRN

## 2023-09-10 MED ORDER — PRENATAL MULTIVITAMIN CH
1.0000 | ORAL_TABLET | Freq: Every day | ORAL | Status: DC
  Administered 2023-09-11 – 2023-09-13 (×3): 1 via ORAL
  Filled 2023-09-10 (×3): qty 1

## 2023-09-10 MED ORDER — PHENYLEPHRINE HCL-NACL 20-0.9 MG/250ML-% IV SOLN
INTRAVENOUS | Status: AC
  Filled 2023-09-10: qty 250

## 2023-09-10 MED ORDER — MENTHOL 3 MG MT LOZG: 1.0000 | LOZENGE | OROMUCOSAL | Status: DC | PRN

## 2023-09-10 MED ORDER — AMISULPRIDE (ANTIEMETIC) 5 MG/2ML IV SOLN: 10.0000 mg | Freq: Once | INTRAVENOUS | Status: DC | PRN

## 2023-09-10 MED ORDER — CEFAZOLIN SODIUM-DEXTROSE 2-4 GM/100ML-% IV SOLN
2.0000 g | INTRAVENOUS | Status: AC
  Administered 2023-09-10: 2 g via INTRAVENOUS

## 2023-09-10 MED ORDER — ONDANSETRON HCL 4 MG/2ML IJ SOLN
INTRAMUSCULAR | Status: DC | PRN
  Administered 2023-09-10: 4 mg via INTRAVENOUS

## 2023-09-10 MED ORDER — COCONUT OIL OIL: 1.0000 | TOPICAL_OIL | Status: DC | PRN

## 2023-09-10 MED ORDER — TRANEXAMIC ACID-NACL 1000-0.7 MG/100ML-% IV SOLN
INTRAVENOUS | Status: DC | PRN
Start: 2023-09-10 — End: 2023-09-10
  Administered 2023-09-10: 1000 mg via INTRAVENOUS

## 2023-09-10 MED ORDER — HYDROMORPHONE HCL 1 MG/ML IJ SOLN: 0.2500 mg | INTRAMUSCULAR | Status: DC | PRN

## 2023-09-10 MED ORDER — MEPERIDINE HCL 25 MG/ML IJ SOLN: 6.2500 mg | INTRAMUSCULAR | Status: DC | PRN

## 2023-09-10 MED ORDER — OXYTOCIN-SODIUM CHLORIDE 30-0.9 UT/500ML-% IV SOLN
INTRAVENOUS | Status: AC
  Filled 2023-09-10: qty 500

## 2023-09-10 MED ORDER — SENNOSIDES-DOCUSATE SODIUM 8.6-50 MG PO TABS
2.0000 | ORAL_TABLET | Freq: Every day | ORAL | Status: DC
Start: 2023-09-11 — End: 2023-09-13
  Administered 2023-09-11 – 2023-09-13 (×3): 2 via ORAL
  Filled 2023-09-10 (×3): qty 2

## 2023-09-10 MED ORDER — OXYCODONE HCL 5 MG PO TABS: 5.0000 mg | ORAL_TABLET | Freq: Once | ORAL | Status: DC | PRN

## 2023-09-10 MED ORDER — MORPHINE SULFATE (PF) 0.5 MG/ML IJ SOLN
INTRAMUSCULAR | Status: DC | PRN
Start: 2023-09-10 — End: 2023-09-10
  Administered 2023-09-10: 150 ug via INTRATHECAL

## 2023-09-10 MED ORDER — OXYCODONE-ACETAMINOPHEN 5-325 MG PO TABS
1.0000 | ORAL_TABLET | ORAL | Status: DC | PRN
  Administered 2023-09-11: 2 via ORAL
  Administered 2023-09-11 (×2): 1 via ORAL
  Administered 2023-09-12 – 2023-09-13 (×4): 2 via ORAL
  Filled 2023-09-10: qty 1
  Filled 2023-09-10: qty 2
  Filled 2023-09-10: qty 1
  Filled 2023-09-10 (×4): qty 2
  Filled 2023-09-10: qty 1

## 2023-09-10 SURGICAL SUPPLY — 31 items
BAG COUNTER SPONGE SURGICOUNT (BAG) ×1 IMPLANT
BENZOIN TINCTURE PRP APPL 2/3 (GAUZE/BANDAGES/DRESSINGS) IMPLANT
CHLORAPREP W/TINT 26 (MISCELLANEOUS) ×1 IMPLANT
CLAMP CORD UMBIL (MISCELLANEOUS) IMPLANT
DRSG OPSITE POSTOP 4X10 (GAUZE/BANDAGES/DRESSINGS) ×1 IMPLANT
ELECT REM PT RETURN 9FT ADLT (ELECTROSURGICAL) ×1 IMPLANT
ELECTRODE REM PT RTRN 9FT ADLT (ELECTROSURGICAL) ×1 IMPLANT
EXTRACTOR VACUUM M CUP 4 TUBE (SUCTIONS) IMPLANT
GAUZE SPONGE 4X4 12PLY STRL LF (GAUZE/BANDAGES/DRESSINGS) IMPLANT
GLOVE BIOGEL PI IND STRL 6.5 (GLOVE) ×1 IMPLANT
GLOVE BIOGEL PI IND STRL 7.0 (GLOVE) ×1 IMPLANT
GLOVE SURG SS PI 6.5 STRL IVOR (GLOVE) ×1 IMPLANT
GOWN STRL REUS W/ TWL LRG LVL3 (GOWN DISPOSABLE) ×2 IMPLANT
KIT ABG SYR 3ML LUER SLIP (SYRINGE) IMPLANT
KIT TURNOVER KIT B (KITS) ×1 IMPLANT
NDL HYPO 25X5/8 SAFETYGLIDE (NEEDLE) IMPLANT
NEEDLE HYPO 25X5/8 SAFETYGLIDE (NEEDLE) IMPLANT
NS IRRIG 1000ML POUR BTL (IV SOLUTION) ×1 IMPLANT
PACK C SECTION WH (CUSTOM PROCEDURE TRAY) ×1 IMPLANT
PAD ABD DERMACEA PRESS 5X9 (GAUZE/BANDAGES/DRESSINGS) IMPLANT
PAD OB MATERNITY 11 LF (PERSONAL CARE ITEMS) ×1 IMPLANT
PENCIL SMOKE EVACUATOR (MISCELLANEOUS) ×1 IMPLANT
RTRCTR C-SECT PINK 25CM LRG (MISCELLANEOUS) IMPLANT
SEPRAFILM MEMBRANE 5X6 (MISCELLANEOUS) IMPLANT
STRIP CLOSURE SKIN 1/2X4 (GAUZE/BANDAGES/DRESSINGS) IMPLANT
SUT PLAIN 0 NONE (SUTURE) IMPLANT
SUT VIC AB 0 CT1 36 (SUTURE) ×4 IMPLANT
SUT VIC AB 4-0 KS 27 (SUTURE) ×1 IMPLANT
TOWEL GREEN STERILE FF (TOWEL DISPOSABLE) ×1 IMPLANT
TRAY FOLEY W/BAG SLVR 14FR (SET/KITS/TRAYS/PACK) ×1 IMPLANT
WATER STERILE IRR 1000ML POUR (IV SOLUTION) ×1 IMPLANT

## 2023-09-10 NOTE — Discharge Summary (Signed)
 Postpartum Discharge Summary     Patient Name: Deanna May DOB: 09-04-1990 MRN: [id number]  Date of admission: 09/10/2023 Delivery date:09/10/2023 Delivering provider: CONSTANT, PEGGY Date of discharge: 09/13/2023  Admitting diagnosis: Pregnancy [Z34.90] Intrauterine pregnancy: [redacted]w[redacted]d     Secondary diagnosis:  Principal Problem:   Pregnancy Active Problems:   history of cesarean x 5   Supervision of high risk pregnancy, antepartum   Echogenic intracardiac focus of fetus on prenatal ultrasound   Polyhydramnios affecting pregnancy   Anemia affecting pregnancy in third trimester   Encounter for cesarean delivery without indication  Additional problems: None    Discharge diagnosis: Term Pregnancy Delivered                                              Post partum procedures: None Complications: None  Hospital course: Scheduled C/S   33 y.o. yo [id number] at [redacted]w[redacted]d was admitted to the hospital 09/10/2023 for scheduled cesarean section with the following indication: Prior Uterine Surgery. Delivery details are as follows:  Membrane Rupture Time/Date: 9:52 AM,09/10/2023  Delivery Method:C-Section, Low Transverse Operative Delivery:N/A Details of operation can be found in separate operative note.  Patient had a postpartum course complicated by development of chest pain. Work-up for ACS, anemia, PE all negative. Resolved with pepcid and found to be from from acid reflux.  She is ambulating, tolerating a regular diet, passing flatus, and urinating well. Patient is discharged home in stable condition on  09/13/23.        Newborn Data: Birth date:09/10/2023 Birth time:9:53 AM Gender:Female Living status:Living Apgars:9 ,9  Weight:3630 g    Magnesium Sulfate received: No BMZ received: No Rhophylac:N/A MMR:N/A T-DaP: Declined Flu: No RSV Vaccine received: No Transfusion:No  Immunizations received: Immunization History  Administered Date(s) Administered   Tdap 10/15/2021    Physical  exam  Vitals:   09/12/23 0532 09/12/23 1249 09/12/23 2045 09/13/23 0534  BP: 132/73 131/79 131/74 123/78  Pulse: 81 69 70 70  Resp: 20 16 17 16   Temp: 98.2 F (36.8 C) 98.5 F (36.9 C) 98.6 F (37 C) 97.9 F (36.6 C)  TempSrc: Oral Axillary Oral Oral  SpO2: 99% 99% 99% 100%  Weight:      Height:       General: alert, cooperative, and no distress Lochia: appropriate Uterine Fundus: firm Incision: Dressing is clean, dry, and intact DVT Evaluation: No cords or calf tenderness. No significant calf/ankle edema. Labs: Lab Results  Component Value Date   WBC 10.0 09/12/2023   HGB 10.5 (L) 09/12/2023   HCT 32.0 (L) 09/12/2023   MCV 84.9 09/12/2023   PLT 235 09/12/2023      Latest Ref Rng & Units 09/12/2023    1:34 PM  CMP  Glucose 70 - 99 mg/dL 87   BUN 6 - 20 mg/dL 5   Creatinine 1.47 - 8.29 mg/dL 5.62   Sodium 130 - 865 mmol/L 138   Potassium 3.5 - 5.1 mmol/L 3.6   Chloride 98 - 111 mmol/L 108   CO2 22 - 32 mmol/L 20   Calcium 8.9 - 10.3 mg/dL 8.7   Total Protein 6.5 - 8.1 g/dL 6.0   Total Bilirubin 0.0 - 1.2 mg/dL 0.2   Alkaline Phos 38 - 126 U/L 139   AST 15 - 41 U/L 19   ALT 0 - 44 U/L 11    Edinburgh Score:  09/10/2023    1:27 PM  Edinburgh Postnatal Depression Scale Screening Tool  I have been able to laugh and see the funny side of things. 0  I have looked forward with enjoyment to things. 0  I have blamed myself unnecessarily when things went wrong. 1  I have been anxious or worried for no good reason. 2  I have felt scared or panicky for no good reason. 1  Things have been getting on top of me. 1  I have been so unhappy that I have had difficulty sleeping. 1  I have felt sad or miserable. 2  I have been so unhappy that I have been crying. 1  The thought of harming myself has occurred to me. 0  Edinburgh Postnatal Depression Scale Total 9   No data recorded  After visit meds:  Allergies as of 09/13/2023   No Known Allergies      Medication  List     TAKE these medications    acetaminophen 500 MG tablet Commonly known as: TYLENOL Take 1-2 tablets (500-1,000 mg total) by mouth every 8 (eight) hours as needed for moderate pain (pain score 4-6). No more than 3,000 mg per day from all sources   famotidine 20 MG tablet Commonly known as: Pepcid Take 1 tablet (20 mg total) by mouth 2 (two) times daily.   ferrous sulfate 325 (65 FE) MG tablet Take 1 tablet (325 mg total) by mouth every other day. What changed: when to take this   ibuprofen 600 MG tablet Commonly known as: ADVIL Take 1 tablet (600 mg total) by mouth every 6 (six) hours.   oxyCODONE-acetaminophen 5-325 MG tablet Commonly known as: PERCOCET/ROXICET Take 1 tablet by mouth every 6 (six) hours as needed for severe pain (pain score 7-10).   Prenatal Vitamin Plus Low Iron 27-1 MG Tabs Take 1 tablet by mouth daily.   senna-docusate 8.6-50 MG tablet Commonly known as: Senokot-S Take 2 tablets by mouth at bedtime as needed for mild constipation.         Discharge home in stable condition Infant Feeding: Bottle and Breast Infant Disposition:home with mother Discharge instruction: per After Visit Summary and Postpartum booklet. Activity: Advance as tolerated. Pelvic rest for 6 weeks.  Diet: routine diet Future Appointments: Future Appointments  Date Time Provider Department Center  09/13/2023  8:00 AM [facility] ECHO 5 [facility]  09/20/2023  9:00 AM [facility] NURSE [facility] None  10/13/2023 10:15 AM Davis, Devon E, PA-C [facility] None   Follow up Visit:   Please schedule this patient for a In person postpartum visit in 4 weeks with the following provider: Any provider for incision check. Additional Postpartum F/U:Incision check 1 week  Low risk pregnancy complicated by:  Delivery mode:  C-Section, Low Transverse Anticipated Birth Control:  IUD at postpartum visit   09/13/2023 Maud Sorenson, MD

## 2023-09-10 NOTE — Op Note (Signed)
 Deanna May PROCEDURE DATE: 09/10/2023  PREOPERATIVE DIAGNOSIS: Intrauterine pregnancy at  [redacted]w[redacted]d weeks gestation; previous uterine incision kerr x3 or greater  POSTOPERATIVE DIAGNOSIS: The same  PROCEDURE:     Cesarean Section  SURGEON:  Dr. Gigi Gin Odell Choung  ASSISTANT: none  INDICATIONS: Deanna May is a 33 y.o. L2G4010 at [redacted]w[redacted]d scheduled for cesarean section secondary to previous uterine incision kerr x3 or greater.  The risks of cesarean section discussed with the patient included but were not limited to: bleeding which may require transfusion or reoperation; infection which may require antibiotics; injury to bowel, bladder, ureters or other surrounding organs; injury to the fetus; need for additional procedures including hysterectomy in the event of a life-threatening hemorrhage; placental abnormalities wth subsequent pregnancies, incisional problems, thromboembolic phenomenon and other postoperative/anesthesia complications. The patient concurred with the proposed plan, giving informed written consent for the procedure.    FINDINGS:  Viable female infant in cephalic presentation.  Apgars 9 and 9, weight, 8 pounds and 0 ounces.  Clear amniotic fluid.  Intact placenta, three vessel cord.  Normal uterus, fallopian tubes and ovaries bilaterally. Dense fascia adhesions with significant diastasis recti in the lower abdomen. Entry into the peritoneal cavity with ease and minimal adhesions. Lower uterine segment extremely thin and bladder densely adherent.  ANESTHESIA:    Spinal INTRAVENOUS FLUIDS:1400 ml ESTIMATED BLOOD LOSS: 301 ml URINE OUTPUT:  100 ml SPECIMENS: Placenta sent to L&D COMPLICATIONS: None immediate  PROCEDURE IN DETAIL:  The patient received intravenous antibiotics and had sequential compression devices applied to her lower extremities while in the preoperative area.  She was then taken to the operating room where anesthesia was induced and was found to be adequate. A foley  catheter was placed into her bladder and attached to Kaeleb Emond gravity. She was then placed in a dorsal supine position with a leftward tilt, and prepped and draped in a sterile manner. After an adequate timeout was performed, a Pfannenstiel skin incision was made with scalpel and carried through to the underlying layer of fascia. The fascia was incised in the midline and this incision was extended bilaterally using the Mayo scissors. Kocher clamps were applied to the superior aspect of the fascial incision and the peritoneum was entered with minimal effort. The Alexis self-retaining retractor was introduced into the abdominal cavity. Attention was turned to the lower uterine segment where a bladder flap was created, and a transverse hysterotomy was made with a scalpel and extended bilaterally bluntly. The infant was successfully delivered, and delayed cord clamping was performed for 1 minute. The cord was clamped and cut and infant was handed over to awaiting neonatology team. Uterine massage was then administered and the placenta delivered intact with three-vessel cord. The uterus was cleared of clot and debris.  The hysterotomy was closed with 0 Vicryl in a running locked fashion. Overall, excellent hemostasis was noted. The pelvis was cleared of all clot and debris. Hemostasis was confirmed on all surfaces.  The peritoneum was reapproximated using 0 vicryl interrupted stitches. The fascia was then closed using 0 Vicryl in a running fashion.  The skin was closed in a subcuticular fashion using 3.0 Vicryl. The patient tolerated the procedure well. Sponge, lap, instrument and needle counts were correct x 2. She was taken to the recovery room in stable condition.    Krina Mraz ConstantMD  09/10/2023 10:40 AM

## 2023-09-10 NOTE — Anesthesia Procedure Notes (Signed)
 Epidural Patient location during procedure: OB Start time: 09/10/2023 9:25 AM End time: 09/10/2023 9:28 AM  Staffing Anesthesiologist: Lannie Fields, DO Performed: anesthesiologist   Preanesthetic Checklist Completed: patient identified, IV checked, risks and benefits discussed, monitors and equipment checked, pre-op evaluation and timeout performed  Epidural Patient position: sitting Prep: DuraPrep and site prepped and draped Patient monitoring: continuous pulse ox, blood pressure, heart rate and cardiac monitor Approach: midline Location: L3-L4 Injection technique: LOR air  Needle:  Needle type: Tuohy  Needle gauge: 17 G Needle length: 9 cm Needle insertion depth: 6 cm Catheter type: closed end flexible Catheter size: 19 Gauge Catheter at skin depth: 11 cm Test dose: negative  Assessment Sensory level: T8 Events: blood not aspirated, no cerebrospinal fluid, injection not painful, no injection resistance, no paresthesia and negative IV test  Additional Notes Patient identified. Risks/Benefits/Options discussed with patient including but not limited to bleeding, infection, nerve damage, paralysis, failed block, incomplete pain control, headache, blood pressure changes, nausea, vomiting, reactions to medication both or allergic, itching and postpartum back pain. Confirmed with bedside nurse the patient's most recent platelet count. Confirmed with patient that they are not currently taking any anticoagulation, have any bleeding history or any family history of bleeding disorders. Patient expressed understanding and wished to proceed. All questions were answered. Sterile technique was used throughout the entire procedure. Please see nursing notes for vital signs. Test dose was given through epidural catheter and negative prior to continuing to dose epidural or start infusion. Warning signs of high block given to the patient including shortness of breath, tingling/numbness in  hands, complete motor block, or any concerning symptoms with instructions to call for help. Patient was given instructions on fall risk and not to get out of bed. All questions and concerns addressed with instructions to call with any issues or inadequate analgesia.    CSE for c section (6th section). 24G sprotte through tuohy, clear CSF, no issues.Reason for block:procedure for pain

## 2023-09-10 NOTE — H&P (Signed)
 Sacheen Vegh is a 33 y.o. female [id number] at 95 weeks presenting for scheduled repeat cesarean section. Patient with prenatal care at [facility] since 15 weeks. Prenatal care complicated by echogenic intracardiac focus with normal NIPT, previous cesarean section x 5. Patient reports good fetal movement, she denies regular contractions, vaginal bleeding or leakage of fluid.  OB History     Gravida  7   Para  5   Term  5   Preterm      AB  1   Living  4      SAB      IAB  1   Ectopic      Multiple  0   Live Births  5          Past Medical History:  Diagnosis Date   Depression 2018   Headache    PONV (postoperative nausea and vomiting)    Past Surgical History:  Procedure Laterality Date   CESAREAN SECTION  2009, 2011, 2014, 2017   CESAREAN SECTION WITH BILATERAL TUBAL LIGATION N/A 08/14/2022   Procedure was C/S only, NO BTL Surgeon: Jackie Plum, MD   Family History: family history includes Allergies in her sister; Asthma in her sister; Autism spectrum disorder in her son and son. Social History:  reports that she has never smoked. She has never used smokeless tobacco. She reports that she does not currently use alcohol. She reports that she does not currently use drugs after having used the following drugs: Marijuana.     Maternal Diabetes: No Genetic Screening: Normal Maternal Ultrasounds/Referrals: Isolated EIF (echogenic intracardiac focus) Fetal Ultrasounds or other Referrals:  None Maternal Substance Abuse:  No Significant Maternal Medications:  None Significant Maternal Lab Results:  Group B Strep positive Number of Prenatal Visits:greater than 3 verified prenatal visits Maternal Vaccinations:declined Tdap and flu Other Comments:  None  Review of Systems See pertinent in HPI. All other systems reviewed and non contributory   Blood pressure 119/69, pulse 66, temperature 97.8 F (36.6 C), temperature source Oral, resp. rate 16, height 5\' 9"   (1.753 m), weight 90.4 kg, last menstrual period 12/11/2022, unknown if currently breastfeeding. Exam Physical Exam  GENERAL: Well-developed, well-nourished female in no acute distress.  LUNGS: Clear to auscultation bilaterally.  HEART: Regular rate and rhythm. ABDOMEN: Soft, nontender, nondistended. Gravid PELVIC: Not indicated EXTREMITIES: No cyanosis, clubbing, or edema, 2+ distal pulses.  Prenatal labs: ABO, Rh: --/--/A POS (04/09 0908) Antibody: NEG (04/09 0908) Rubella: 1.01 (09/26 0940) RPR: NON REACTIVE (04/09 0903)  HBsAg: Negative (09/26 0940)  HIV: Non Reactive (01/24 0935)  GBS: Positive/-- (03/20 1329)   Assessment/Plan: 33 yo G7P5014 at 39 weeks here for scheduled repeat cesarean section - Risks, benefits and alternatives were explained including but not limited to risks of bleeding, infection, damage to adjacent organs. Patient verbalized understanding and all questions were answered - Patient plans outpatient IUD placement   Benjimin Hadden 09/10/2023, 8:57 AM

## 2023-09-10 NOTE — Anesthesia Postprocedure Evaluation (Signed)
 Anesthesia Post Note  Patient: Scientist, research (physical sciences)  Procedure(s) Performed: CESAREAN DELIVERY     Patient location during evaluation: PACU Anesthesia Type: Combined Spinal/Epidural Level of consciousness: awake and alert, oriented and patient cooperative Pain management: pain level controlled Vital Signs Assessment: post-procedure vital signs reviewed and stable Respiratory status: spontaneous breathing, nonlabored ventilation and respiratory function stable Cardiovascular status: blood pressure returned to baseline and stable Postop Assessment: no headache, no backache, spinal receding and patient able to bend at knees Anesthetic complications: no   No notable events documented.  Last Vitals:  Vitals:   09/10/23 1115 09/10/23 1130  BP: 113/71 124/72  Pulse: 74 64  Resp: 16 14  Temp: 36.5 C   SpO2: 100% 100%    Last Pain:  Vitals:   09/10/23 1152  TempSrc:   PainSc: 7    Pain Goal:                Epidural/Spinal Function Cutaneous sensation: Tingles (09/10/23 1115), Patient able to flex knees: Yes (09/10/23 1115), Patient able to lift hips off bed: No (09/10/23 1115), Back pain beyond tenderness at insertion site: No (09/10/23 1115), Progressively worsening motor and/or sensory loss: No (09/10/23 1115), Bowel and/or bladder incontinence post epidural: No (09/10/23 1115)  Lannie Fields

## 2023-09-10 NOTE — Transfer of Care (Signed)
 Immediate Anesthesia Transfer of Care Note  Patient: Deanna May  Procedure(s) Performed: CESAREAN DELIVERY  Patient Location: PACU  Anesthesia Type:Spinal  Level of Consciousness: awake, alert , and oriented  Airway & Oxygen Therapy: Patient Spontanous Breathing  Post-op Assessment: Report given to RN and Post -op Vital signs reviewed and stable  Post vital signs: Reviewed and stable  Last Vitals:  Vitals Value Taken Time  BP 130/74 09/10/23 1046  Temp 36.4 C 09/10/23 1045  Pulse 65 09/10/23 1052  Resp 16 09/10/23 1052  SpO2 100 % 09/10/23 1052  Vitals shown include unfiled device data.  Last Pain:  Vitals:   09/10/23 1045  TempSrc: Oral         Complications: No notable events documented.

## 2023-09-10 NOTE — Lactation Note (Signed)
 This note was copied from a baby's chart. Lactation Consultation Note  Patient Name: Deanna May [id number] Date: 09/10/2023 Age 33 hrs Reason for consult: Follow-up assessment;Term  P6, 39 wks, @ 7 hrs of life. Mom receptive to latching baby. Demonstrated starting with hand expression, demonstrated steps of latching. Cross cradle on both breasts- baby responds beautifully- 30+ minutes overall, Infant re-latching self after LC exit. Discussed expectations @ breast- Day 1- sleepy/ feed every 3 hrs/ even 10 minutes is okay, Day 2 more awake/ feeding cues/longer feeds, and cluster feeding overnights brings milk in. Highlighted breast stimulation is tied directly to milk production. Discussed hands on breast and baby, keeping baby awake @ breast. Starting with hand expression & breast compression to get baby working @ breast, and gentle stimulation to keep baby working @ breast. Encouraged EBM for nipple care post feed. LC services and milk storage shared. Encouraged mom to call for assist anytime desired.  Maternal Data Has patient been taught Hand Expression?: Yes Does the patient have breastfeeding experience prior to this delivery?: Yes How long did the patient breastfeed?: Few months  Feeding Mother's Current Feeding Choice: Breast Milk  LATCH Score Latch: Grasps breast easily, tongue down, lips flanged, rhythmical sucking.  Audible Swallowing: Spontaneous and intermittent  Type of Nipple: Everted at rest and after stimulation  Comfort (Breast/Nipple): Soft / non-tender  Hold (Positioning): Assistance needed to correctly position infant at breast and maintain latch.  LATCH Score: 9   Lactation Tools Discussed/Used    Interventions Interventions: Breast feeding basics reviewed;Assisted with latch;Hand express;Breast compression;Hand pump;Education;LC Services brochure;CDC milk storage guidelines  Discharge Pump: Manual;Personal (Hand pump provided- Highlighted best for  softening engorged breast)  Consult Status Consult Status: Follow-up Date: 09/11/23 Follow-up type: In-patient    [facility] 09/10/2023, 5:58 PM

## 2023-09-11 LAB — CBC
HCT: 29.9 % — ABNORMAL LOW (ref 36.0–46.0)
Hemoglobin: 9.8 g/dL — ABNORMAL LOW (ref 12.0–15.0)
MCH: 27.4 pg (ref 26.0–34.0)
MCHC: 32.8 g/dL (ref 30.0–36.0)
MCV: 83.5 fL (ref 80.0–100.0)
Platelets: 262 10*3/uL (ref 150–400)
RBC: 3.58 MIL/uL — ABNORMAL LOW (ref 3.87–5.11)
RDW: 20.9 % — ABNORMAL HIGH (ref 11.5–15.5)
WBC: 16.6 10*3/uL — ABNORMAL HIGH (ref 4.0–10.5)
nRBC: 0 % (ref 0.0–0.2)

## 2023-09-11 MED ORDER — FERROUS SULFATE 325 (65 FE) MG PO TABS
325.0000 mg | ORAL_TABLET | ORAL | Status: DC
  Administered 2023-09-11 – 2023-09-13 (×2): 325 mg via ORAL
  Filled 2023-09-11 (×2): qty 1

## 2023-09-11 NOTE — Progress Notes (Signed)
 POSTPARTUM PROGRESS NOTE  POD #1  Subjective:  Deanna May is a 33 y.o. [id number] s/p rLTCS at [redacted]w[redacted]d. No acute events overnight. She reports she is doing well. She denies any problems with ambulating, voiding or po intake. Denies nausea or vomiting. She has passed flatus. Pain is well controlled.  Lochia is appropriate.  Objective: Blood pressure 109/61, pulse (!) 56, temperature 97.8 F (36.6 C), temperature source Oral, resp. rate 17, height 5\' 9"  (1.753 m), weight 90.4 kg, last menstrual period 12/11/2022, SpO2 100%, unknown if currently breastfeeding.  Physical Exam:  General: alert, cooperative and no distress Chest: no respiratory distress Heart: regular rate, distal pulses intact Uterine Fundus: firm, appropriately tender DVT Evaluation: No calf swelling or tenderness Extremities: trace edema Skin: warm, dry; incision clean/dry/intact w/honeycomb dressing in place  Recent Labs    09/10/23 1420 09/11/23 0410  HGB 12.1 9.8*  HCT 36.3 29.9*    Assessment/Plan: Deanna May is a 33 y.o. A5W0981 s/p rLTCS at [redacted]w[redacted]d for scheduled CS.  POD#1 - Doing welll; pain well controlled. H/H appropriate  Routine postpartum care  OOB, ambulated  Lovenox for VTE prophylaxis Acute blood loss Anemia: asymptomatic  Start po ferrous sulfate every other day  Contraception: Out patient Mirena IUD Feeding: Bottle  Dispo: Plan for discharge 4/13.   LOS: 1 day   Darrow End, MD OB Fellow  09/11/2023, 5:07 PM

## 2023-09-11 NOTE — Clinical Social Work Maternal (Signed)
 CLINICAL SOCIAL WORK MATERNAL/CHILD NOTE  Patient Details  Name: Deanna May MRN: [id number] Date of Birth: Sep 11, 1990  Date:  09/11/2023  Clinical Social Worker Initiating Note:  Dellar Fenton Boyd-Gilyard Date/Time: Initiated:  09/11/23/1134     Child's Name:  Deanna May   Biological Parents:  Father, Mother (Per MOB, FOB Arnoldo Lapping Belo/11/27/86) is currently incarcerated serving a 3 year sentence.)   Need for Interpreter:  None   Reason for Referral:  Other (Comment) (recent loss to SIDS and children not in her custody.)   Address:  [address]  [address] [place] [address]    Phone number:  [phone] (home)     Additional phone number:   Household Members/Support Persons (HM/SP):   Household Member/Support Person 1, Household Member/Support Person 2, Household Member/Support Person 3, Household Member/Support Person 4   HM/SP Name Relationship DOB or Age  HM/SP -1 Tysir Barley son 10/16/07  HM/SP -2 Nysir Barley son 10/30/2009  HM/SP -3 Amilah Blaise Bumps daughter 09/16/12  HM/SP -4 Shirline Dover son 09/23/2015  HM/SP -5        HM/SP -6        HM/SP -7        HM/SP -8          Natural Supports (not living in the home):  Parent, Immediate Family   Professional Supports: None   Employment: Part-time   Type of Work: Office manager at [employer]   Education:  Some College   Homebound arranged:    Surveyor, quantity Resources:  [employer]   Other Resources:   (MOB plans to apply for Sales executive and WIC.)   Cultural/Religious Considerations Which May Impact Care:  None Reported  Strengths:  Ability to meet basic needs  , Merchandiser, retail, Home prepared for child     Psychotropic Medications:         Pediatrician:    Armed forces operational officer area  Pediatrician List:   Armed forces operational officer Other Technical sales engineer)  [place]    [facility]      Pediatrician Fax Number:    Risk Factors/Current Problems:  Substance Use  ,  Mental Health Concerns     Cognitive State:  Alert  , Insightful  , Able to Concentrate  , Goal Oriented  , Linear Thinking     Mood/Affect:  Comfortable  , Calm  , Interested  , Happy  , Relaxed     CSW Assessment: CSW met with MOB at bedside in room 521 to complete assessment for consult regarding hx of THC use. Upon this writers arrival, MOB was resting in bed while was being attended to by hospital staff.  This Clinical research associate introduced self and explained role and reason for visit. MOB was polite, inviting, and easy to engage. This Clinical research associate inquired about substance use hx. MOB noted she used [facility] recreationally prior to confirming pregnancy and quit early on. This Clinical research associate educated MOB on the hospitals policy and procedure regarding substance use and mandated reporting for positive screens. MOB verbalized understanding. This Clinical research associate informed MOB that if UDS or CDS come back positive she will be notified and a report will be made to [facility]. MOB verbalized understanding and noted no further questions as she is confident noting will appear since she quit so many months ago. MOB acknowledged a hx of CPS involvement. Per MOB, her last open case was on 12-18-22, following the death of her infant child  [name] Belo.  MOB shared that [name] died of SIDS and a report was made to [facility] CPS. MOB communicated that her case closed on 01/2023. This Clinical research associate assessed for other psychosocial stressors. MOB shared that the death of her daughter in 09/26/2022 was a "Huge stressor for me." MOB communicated that she received counseling however she was not pleased with the service. The Clinical research associate provided MOB with other counseling services. MOB acknowledged a hx of depression and PMADs.  Per MOB her symptoms subsides after a few weeks postpartum. The Clinical research associate assessed for safety and MOB denied SI, HI, and reports having a good support team that consist of her parents and other immediate family members.  MOB notes she has no further  needs and has everything she needs for baby's arrival home to include car seat, crib, bassinet, and clothing items. This Clinical research associate provided detail information about SIDS.   MOB shared that her older 2  children lives in [place] with their father, Timeak Barley.  Per MOB, this was a personal decision and CPS was not involved.   At this time, CSW has no barriers to d/c. CSW confirmed with [facility] CPS there is not currently an open case or concerns for family.   CSW Plan/Description:  No Further Intervention Required/No Barriers to Discharge, Sudden Infant Death Syndrome (SIDS) Education, Perinatal Mood and Anxiety Disorder (PMADs) Education, Other Patient/Family Education, Hospital Drug Screen Policy Information, Other Information/Referral to [facility], CSW Will Continue to Monitor Umbilical Cord Tissue Drug Screen Results and Make Report if Warranted   Zygmunt Hives, MSW, LCSW Clinical Social Work [phone]   Selinda Dales, LCSW 09/11/2023, 11:44 AM

## 2023-09-12 ENCOUNTER — Inpatient Hospital Stay (HOSPITAL_COMMUNITY)

## 2023-09-12 LAB — D-DIMER, QUANTITATIVE: D-Dimer, Quant: 0.98 ug{FEU}/mL — ABNORMAL HIGH (ref 0.00–0.50)

## 2023-09-12 LAB — COMPREHENSIVE METABOLIC PANEL WITH GFR
ALT: 11 U/L (ref 0–44)
AST: 19 U/L (ref 15–41)
Albumin: 2.6 g/dL — ABNORMAL LOW (ref 3.5–5.0)
Alkaline Phosphatase: 139 U/L — ABNORMAL HIGH (ref 38–126)
Anion gap: 10 (ref 5–15)
BUN: 5 mg/dL — ABNORMAL LOW (ref 6–20)
CO2: 20 mmol/L — ABNORMAL LOW (ref 22–32)
Calcium: 8.7 mg/dL — ABNORMAL LOW (ref 8.9–10.3)
Chloride: 108 mmol/L (ref 98–111)
Creatinine, Ser: 0.93 mg/dL (ref 0.44–1.00)
GFR, Estimated: >60 mL/min (ref >60)
Glucose, Bld: 87 mg/dL (ref 70–99)
Potassium: 3.6 mmol/L (ref 3.5–5.1)
Sodium: 138 mmol/L (ref 135–145)
Total Bilirubin: 0.2 mg/dL (ref 0.0–1.2)
Total Protein: 6 g/dL — ABNORMAL LOW (ref 6.5–8.1)

## 2023-09-12 LAB — CBC
HCT: 32 % — ABNORMAL LOW (ref 36.0–46.0)
Hemoglobin: 10.5 g/dL — ABNORMAL LOW (ref 12.0–15.0)
MCH: 27.9 pg (ref 26.0–34.0)
MCHC: 32.8 g/dL (ref 30.0–36.0)
MCV: 84.9 fL (ref 80.0–100.0)
Platelets: 235 10*3/uL (ref 150–400)
RBC: 3.77 MIL/uL — ABNORMAL LOW (ref 3.87–5.11)
RDW: 21.4 % — ABNORMAL HIGH (ref 11.5–15.5)
WBC: 10 10*3/uL (ref 4.0–10.5)
nRBC: 0 % (ref 0.0–0.2)

## 2023-09-12 LAB — BRAIN NATRIURETIC PEPTIDE: B Natriuretic Peptide: 155.7 pg/mL — ABNORMAL HIGH (ref 0.0–100.0)

## 2023-09-12 LAB — TROPONIN I (HIGH SENSITIVITY)
Troponin I (High Sensitivity): 5 ng/L (ref <18)
Troponin I (High Sensitivity): 5 ng/L (ref <18)

## 2023-09-12 MED ORDER — TETANUS-DIPHTH-ACELL PERTUSSIS 5-2.5-18.5 LF-MCG/0.5 IM SUSY
0.5000 mL | PREFILLED_SYRINGE | Freq: Once | INTRAMUSCULAR | Status: DC
Start: 2023-09-12 — End: 2023-09-13
  Filled 2023-09-12: qty 0.5

## 2023-09-12 MED ORDER — FAMOTIDINE 20 MG PO TABS
20.0000 mg | ORAL_TABLET | ORAL | Status: AC
  Administered 2023-09-12: 20 mg via ORAL
  Filled 2023-09-12: qty 1

## 2023-09-12 MED ORDER — ALUM & MAG HYDROXIDE-SIMETH 200-200-20 MG/5ML PO SUSP
30.0000 mL | Freq: Once | ORAL | Status: DC
  Filled 2023-09-12: qty 30

## 2023-09-12 MED ORDER — LIDOCAINE VISCOUS HCL 2 % MT SOLN
15.0000 mL | Freq: Once | OROMUCOSAL | Status: AC
Start: 2023-09-12 — End: 2023-09-12
  Administered 2023-09-12: 15 mL via ORAL
  Filled 2023-09-12: qty 15

## 2023-09-12 NOTE — Progress Notes (Signed)
 POSTPARTUM PROGRESS NOTE  POD #2  Subjective:  Deanna May is a 33 y.o. Z6X0960 s/p repeat LTCS at [redacted]w[redacted]d. No acute events overnight. She reports she is doing well. She denies any problems with ambulating, voiding or po intake. Denies nausea or vomiting. She has passed flatus. Pain is moderately controlled -- reports some burning epigastric pain.  Lochia is minimal.  Objective: Blood pressure 132/73, pulse 81, temperature 98.2 F (36.8 C), temperature source Oral, resp. rate 20, height 5\' 9"  (1.753 m), weight 90.4 kg, last menstrual period 12/11/2022, SpO2 99%, unknown if currently breastfeeding.  Physical Exam:  General: alert, cooperative and no distress Chest: no respiratory distress Heart: regular rate, distal pulses intact Uterine Fundus: firm, appropriately tender DVT Evaluation: No calf swelling or tenderness Extremities: No edema Skin: warm, dry; incision clean/dry/intact w/ honeycomb dressing in place  Recent Labs    09/10/23 1420 09/11/23 0410  HGB 12.1 9.8*  HCT 36.3 29.9*    Assessment/Plan: Deanna May is a 33 y.o. A5W0981 s/p rLTCS at [redacted]w[redacted]d.  POD#2 - Doing welll; pain well controlled. H/H appropriate  Routine postpartum care  OOB, ambulated  Lovenox for VTE prophylaxis  Epigastric/chest pain - Describes as burning pain, suspect 2/2 GERD  will trial Maalox and obtain EKG  Anemia: asymptomatic  Po ferrous sulfate every other day  Contraception: OP IUD Feeding: Breast  Dispo: Plan for discharge today vs tomorrow pending progress.   LOS: 2 days   Melanie Spires, MD OB Fellow  09/12/2023, 11:30 AM

## 2023-09-12 NOTE — Lactation Note (Signed)
 This note was copied from a baby's chart. Lactation Consultation Note  Patient Name: Deanna May Date: 09/12/2023 Age:33 hours Reason for consult: Follow-up assessment;Term  Baby out of room for circumcision.  Feed on demand with cues.  Goal 8-12+ times per day after first 24 hrs.  Place baby STS if not cueing.  Encouraged offering both breasts before formula. Reviewed engorgement care and monitoring voids/stools.  Feeding Mother's Current Feeding Choice: Breast Milk and Formula   Interventions Interventions: Education;Hand pump  Discharge Discharge Education: Engorgement and breast care;Warning signs for feeding baby  Consult Status Consult Status: Complete Date: 09/12/23  Vicenta Graft Boschen  RN IBCLC 09/12/2023, 11:38 AM

## 2023-09-12 NOTE — Plan of Care (Signed)

## 2023-09-12 NOTE — Progress Notes (Signed)
 Brief progress note:  Pt c/o chest pain/epigastric pain on rounds this AM. She denies radiation of pain and mainly points to her epigastrium. No tenderness on palpation of chest wall and exam otherwise unremarkable. EKG obtained and shows NSR with no ST or T wave changes noted. Attempted to give GI cocktail to see if reflux related, but pt declined Maalox, only took Lidocaine part of solution w/o relief of symptoms. Pepcid ordered and given. Labs obtained -- HgB ok, electrolytes wnl, trop neg, BNP elevated, D-dimer 0.98. Per YEARS algorithm, able to r/o PE. Given elevated BNP, reported CP, and minimal relief w Pepcid, will proceed w CXR and ECHO.  Melanie Spires, MD OB Fellow, Faculty Practice [facility], [facility] for [facility]

## 2023-09-13 ENCOUNTER — Inpatient Hospital Stay (HOSPITAL_COMMUNITY)

## 2023-09-13 DIAGNOSIS — R079 Chest pain, unspecified: Secondary | ICD-10-CM | POA: Diagnosis not present

## 2023-09-13 LAB — ECHOCARDIOGRAM COMPLETE
Area-P 1/2: 3.37 cm2
Calc EF: 61.7 %
Height: 69 in
S' Lateral: 3.1 cm
Single Plane A2C EF: 68.1 %
Single Plane A4C EF: 54.8 %
Weight: 3190.4 [oz_av]

## 2023-09-13 MED ORDER — ACETAMINOPHEN 325 MG PO TABS
650.0000 mg | ORAL_TABLET | Freq: Four times a day (QID) | ORAL | Status: DC | PRN
  Administered 2023-09-13: 650 mg via ORAL
  Filled 2023-09-13: qty 2

## 2023-09-13 MED ORDER — OXYCODONE-ACETAMINOPHEN 5-325 MG PO TABS: 1.0000 | ORAL_TABLET | Freq: Four times a day (QID) | ORAL | 0 refills | Status: AC | PRN

## 2023-09-13 MED ORDER — FAMOTIDINE 20 MG PO TABS: 20.0000 mg | ORAL_TABLET | Freq: Two times a day (BID) | ORAL | 0 refills | Status: AC

## 2023-09-13 MED ORDER — IBUPROFEN 600 MG PO TABS
600.0000 mg | ORAL_TABLET | Freq: Four times a day (QID) | ORAL | 0 refills | Status: AC
Start: 2023-09-13 — End: ?

## 2023-09-13 MED ORDER — SENNOSIDES-DOCUSATE SODIUM 8.6-50 MG PO TABS: 2.0000 | ORAL_TABLET | Freq: Every evening | ORAL | 0 refills | Status: AC | PRN

## 2023-09-13 MED ORDER — ONDANSETRON 4 MG PO TBDP
4.0000 mg | ORAL_TABLET | Freq: Three times a day (TID) | ORAL | Status: DC | PRN
  Administered 2023-09-13: 4 mg via ORAL
  Filled 2023-09-13: qty 1

## 2023-09-13 MED ORDER — ACETAMINOPHEN 500 MG PO TABS: 500.0000 mg | ORAL_TABLET | Freq: Three times a day (TID) | ORAL | 0 refills | Status: AC | PRN

## 2023-09-13 NOTE — Lactation Note (Signed)
 This note was copied from a baby's chart. Lactation Consultation Note  Patient Name: Deanna May [id number] Date: 09/13/2023 Age:33 hours Reason for consult: Follow-up assessment;Maternal discharge;Term  P6, 39 wks, @ 75 hrs of life. Latched infant using football on right breast. Transitional milk easily hane expressed. Encouraged hand expression and breast compression, with delivery of a few free drops to start - great way to get and keep infant working @ breast through feed. Encouraged mom to keep working on big mouth latch with baby and use EBM or coconut oil after each feed. Discussed cluster feeding overnight/ early morning brings in our milk supply, shared expectations of milk coming in. Highlighted risk of engorgement. Discussed hand pump/express to soften breasts, motrin as anti-inflammatory, and ice packs for 10-20 minutes post feed/pumping if still over-full is the best treatments for inflamed/engorged breasts.  Maternal Data Has patient been taught Hand Expression?: Yes Does the patient have breastfeeding experience prior to this delivery?: Yes  Feeding Mother's Current Feeding Choice: Breast Milk and Formula Nipple Type: Slow - flow  LATCH Score Latch: Grasps breast easily, tongue down, lips flanged, rhythmical sucking.  Audible Swallowing: Spontaneous and intermittent  Type of Nipple: Everted at rest and after stimulation  Comfort (Breast/Nipple): Soft / non-tender  Hold (Positioning): Assistance needed to correctly position infant at breast and maintain latch.  LATCH Score: 9   Lactation Tools Discussed/Used    Interventions Interventions: Breast feeding basics reviewed;Assisted with latch;Hand express;Breast compression;Expressed milk;Coconut oil;Hand pump;Education;LC Services brochure;CDC milk storage guidelines  Discharge Discharge Education: Engorgement and breast care Pump: Manual;Personal (Hand pump with 24, 27, and 30 mm flange provided.)  Consult  Status Consult Status: Complete Date: 09/13/23 Follow-up type: In-patient    Damere Brandenburg 09/13/2023, 1:18 PM

## 2023-09-13 NOTE — Progress Notes (Signed)
  Echocardiogram 2D Echocardiogram has been performed.  Deanna May 09/13/2023, 8:45 AM

## 2023-09-16 ENCOUNTER — Encounter: Admitting: Obstetrics and Gynecology

## 2023-09-20 ENCOUNTER — Telehealth (HOSPITAL_COMMUNITY): Payer: Self-pay | Admitting: *Deleted

## 2023-09-20 ENCOUNTER — Ambulatory Visit

## 2023-09-20 NOTE — Telephone Encounter (Signed)
 09/20/2023  Name: Deanna May MRN: [id number] DOB: January 17, 1991  Reason for Call:  Transition of Care Hospital Discharge Call  Contact Status: Patient Contact Status: Complete  Language assistant needed: Interpreter Mode: Interpreter Not Needed        Follow-Up Questions: Do You Have Any Concerns About Your Health As You Heal From Delivery?: No Do You Have Any Concerns About Your Infants Health?: No  Edinburgh Postnatal Depression Scale:  In the Past 7 Days: I have been able to laugh and see the funny side of things.: As much as I always could I have looked forward with enjoyment to things.: As much as I ever did I have blamed myself unnecessarily when things went wrong.: No, never I have been anxious or worried for no good reason.: No, not at all I have felt scared or panicky for no good reason.: No, not at all Things have been getting on top of me.: No, most of the time I have coped quite well I have been so unhappy that I have had difficulty sleeping.: Not at all I have felt sad or miserable.: Not very often I have been so unhappy that I have been crying.: No, never The thought of harming myself has occurred to me.: Never Edinburgh Postnatal Depression Scale Total: 2  PHQ2-9 Depression Scale:     Discharge Follow-up: Edinburgh score requires follow up?: No Patient was advised of the following resources:: Support Group, Breastfeeding Support Group  Post-discharge interventions: Reviewed Newborn Safe Sleep Practices  Pearlie Bougie, RN 09/20/2023 15:58

## 2023-09-23 ENCOUNTER — Encounter: Admitting: Obstetrics and Gynecology

## 2023-10-13 ENCOUNTER — Ambulatory Visit: Admitting: Physician Assistant

## 2023-11-17 ENCOUNTER — Ambulatory Visit: Admitting: Obstetrics and Gynecology
